# Patient Record
Sex: Male | Born: 2000
Health system: Southern US, Community
[De-identification: ages and names within clinical notes are randomized; demographics above are authoritative.]

## PROBLEM LIST (undated history)

## (undated) DIAGNOSIS — I10 Essential (primary) hypertension: Secondary | ICD-10-CM

---
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
## 2018-02-11 ENCOUNTER — Other Ambulatory Visit: Payer: Self-pay

## 2018-02-11 ENCOUNTER — Ambulatory Visit (HOSPITAL_COMMUNITY)
Admission: EM | Admit: 2018-02-11 | Discharge: 2018-02-11 | Disposition: A | Discharge status: Home / Self Care | Payer: 59 | Attending: Family Medicine | Admitting: Family Medicine

## 2018-02-11 ENCOUNTER — Encounter (HOSPITAL_COMMUNITY): Payer: Self-pay | Admitting: Emergency Medicine

## 2018-02-11 DIAGNOSIS — L730 Acne keloid: Secondary | ICD-10-CM

## 2018-02-11 MED ORDER — CEPHALEXIN 500 MG PO CAPS: 500.0000 mg | ORAL_CAPSULE | Freq: Two times a day (BID) | ORAL | 0 refills | Status: DC

## 2018-02-11 NOTE — ED Triage Notes (Signed)
Pt has a few small bumps that look like warts on the back of his head at the base.

## 2018-02-24 NOTE — ED Provider Notes (Signed)
[facility]   [id number] 02/11/18 Arrival Time: 1844  ASSESSMENT & PLAN:  1. Acne nuchae keloidalis     Meds ordered this encounter  Medications  . cephALEXin (KEFLEX) 500 MG capsule    Sig: Take 1 capsule (500 mg total) by mouth 2 (two) times daily.    Dispense:  20 capsule    Refill:  0   Do not shave. Should improve. He is very concerned over possible infection of this area. Given relatively quick onset, will tx with Keflex empirically.  Will follow up with PCP or here if worsening or failing to improve as anticipated. Reviewed expectations re: course of current medical issues. Questions answered. Outlined signs and symptoms indicating need for more acute intervention. Patient verbalized understanding. After Visit Summary given.   SUBJECTIVE:  Michael Hurst is a 17 y.o. male who presents with a skin complaint.   Location: posterior neck at hairline Has noticed for the past few days ago after shaving area. Associated pruritis? none Associated pain? none Progression: stable  Drainage? No  Known trigger? No  New soaps/lotions/topicals/detergents/environmental exposures? No Contacts with similar? No Recent travel? No  Other associated symptoms: none Therapies tried thus far: none Arthralgia or myalgia? none Recent illness? none Fever? none No specific aggravating or alleviating factors reported. No h/o similar reported.  ROS: As per HPI.  OBJECTIVE: Vitals:   02/11/18 1906  BP: (!) 143/49  Pulse: 67  Temp: 98.1 F (36.7 C)  TempSrc: Oral  SpO2: 100%    General appearance: alert; no distress Lungs: clear to auscultation bilaterally Heart: regular rate and rhythm Extremities: no edema Skin: warm and dry; signs of infection: no; acne nuchae keloidialis of posterior neck at hairline Psychological: alert and cooperative; normal mood and affect  No Known Allergies   Social History   Socioeconomic History  . Marital status: Single   Spouse name: Not on file  . Number of children: Not on file  . Years of education: Not on file  . Highest education level: Not on file  Occupational History  . Not on file  Social Needs  . Financial resource strain: Not on file  . Food insecurity:    Worry: Not on file    Inability: Not on file  . Transportation needs:    Medical: Not on file    Non-medical: Not on file  Tobacco Use  . Smoking status: Never Smoker  . Smokeless tobacco: Never Used  Substance and Sexual Activity  . Alcohol use: No  . Drug use: No  . Sexual activity: Not on file  Lifestyle  . Physical activity:    Days per week: Not on file    Minutes per session: Not on file  . Stress: Not on file  Relationships  . Social connections:    Talks on phone: Not on file    Gets together: Not on file    Attends religious service: Not on file    Active member of club or organization: Not on file    Attends meetings of clubs or organizations: Not on file    Relationship status: Not on file  . Intimate partner violence:    Fear of current or ex partner: Not on file    Emotionally abused: Not on file    Physically abused: Not on file    Forced sexual activity: Not on file  Other Topics Concern  . Not on file  Social History Narrative  . Not on file   History  reviewed. No pertinent family history. History reviewed. No pertinent surgical history.   Mardella Layman, MD 02/24/18 [phone]

## 2018-05-05 ENCOUNTER — Other Ambulatory Visit: Payer: Self-pay

## 2018-05-05 ENCOUNTER — Ambulatory Visit (HOSPITAL_COMMUNITY)
Admission: EM | Admit: 2018-05-05 | Discharge: 2018-05-05 | Disposition: A | Discharge status: Home / Self Care | Payer: 59 | Attending: Family Medicine | Admitting: Family Medicine

## 2018-05-05 ENCOUNTER — Encounter (HOSPITAL_COMMUNITY): Payer: Self-pay | Admitting: Emergency Medicine

## 2018-05-05 DIAGNOSIS — L730 Acne keloid: Secondary | ICD-10-CM | POA: Diagnosis not present

## 2018-05-05 MED ORDER — CLINDAMYCIN PHOSPHATE 1 % EX SOLN: CUTANEOUS | 0 refills | Status: DC

## 2018-05-05 MED ORDER — FLUOCINONIDE-E 0.05 % EX CREA: 1.0000 "application " | TOPICAL_CREAM | Freq: Two times a day (BID) | CUTANEOUS | 0 refills | Status: DC

## 2018-05-05 NOTE — ED Triage Notes (Signed)
Per pt he has a rash that has came back and is itching and sore on the back of his neck.

## 2018-05-05 NOTE — ED Provider Notes (Signed)
[facility]   [id number] 05/05/18 Arrival Time: 1144  ASSESSMENT & PLAN:  1. Acne nuchae keloidalis     Meds ordered this encounter  Medications  . clindamycin (CLEOCIN T) 1 % external solution    Sig: Apply to your skin once every morning.    Dispense:  60 mL    Refill:  0  . fluocinonide-emollient (LIDEX-E) 0.05 % cream    Sig: Apply 1 application topically 2 (two) times daily.    Dispense:  30 g    Refill:  0   He is going to schedule f/u with dermatology. May f/u here as needed. Avoid shaving/trimming hair too closely.  Reviewed expectations re: course of current medical issues. Questions answered. Outlined signs and symptoms indicating need for more acute intervention. Patient verbalized understanding. After Visit Summary given.   SUBJECTIVE:  Isabel CapriceVictor C Brouwer is a 17 y.o. male who presents with a skin complaint. Seen here 02/11/2018. Improved until he got his hair trimmed recently.  Location: posterior scalp at hairline Onset: gradual Duration: "maybe a week" Associated pruritis? none Associated pain? none Progression: stable  Drainage? No  Known trigger? quesitons related to trimming hair New soaps/lotions/topicals/detergents/environmental exposures? No Contacts with similar? No Recent travel? No  Other associated symptoms: none Therapies tried thus far: none since last visit Arthralgia or myalgia? none Recent illness? none Fever? none No specific aggravating or alleviating factors reported.  ROS: As per HPI.  OBJECTIVE: Vitals:   05/05/18 1202 05/05/18 1220  BP: 138/80   Pulse: 69   Resp: 16   Temp: 98.3 F (36.8 C)   TempSrc: Oral   SpO2: 100%   Weight: 104.3 kg 104.3 kg  Height: 6\' 3"  (1.905 m) 6\' 3"  (1.905 m)    General appearance: alert; no distress Lungs: clear to auscultation bilaterally Heart: regular rate and rhythm Extremities: no edema Skin: warm and dry; acne nuchae keloidialis of posterior neck at hairline (not as  significant vs last visit); no drainage; no tenderness to touch; no areas of fluctuance  No Known Allergies   Social History   Socioeconomic History  . Marital status: Single    Spouse name: Not on file  . Number of children: Not on file  . Years of education: Not on file  . Highest education level: Not on file  Occupational History  . Not on file  Social Needs  . Financial resource strain: Not on file  . Food insecurity:    Worry: Not on file    Inability: Not on file  . Transportation needs:    Medical: Not on file    Non-medical: Not on file  Tobacco Use  . Smoking status: Never Smoker  . Smokeless tobacco: Never Used  Substance and Sexual Activity  . Alcohol use: No  . Drug use: No  . Sexual activity: Not on file  Lifestyle  . Physical activity:    Days per week: Not on file    Minutes per session: Not on file  . Stress: Not on file  Relationships  . Social connections:    Talks on phone: Not on file    Gets together: Not on file    Attends religious service: Not on file    Active member of club or organization: Not on file    Attends meetings of clubs or organizations: Not on file    Relationship status: Not on file  . Intimate partner violence:    Fear of current or ex partner: Not on  file    Emotionally abused: Not on file    Physically abused: Not on file    Forced sexual activity: Not on file  Other Topics Concern  . Not on file  Social History Narrative  . Not on file   History reviewed. No pertinent family history. History reviewed. No pertinent surgical history.   Mardella Layman, MD 05/06/18 1126

## 2019-08-05 ENCOUNTER — Other Ambulatory Visit: Payer: Self-pay

## 2019-08-05 ENCOUNTER — Encounter (HOSPITAL_COMMUNITY): Payer: Self-pay

## 2019-08-05 ENCOUNTER — Ambulatory Visit (HOSPITAL_COMMUNITY)
Admission: EM | Admit: 2019-08-05 | Discharge: 2019-08-05 | Disposition: A | Discharge status: Home / Self Care | Payer: 59 | Attending: Urgent Care | Admitting: Urgent Care

## 2019-08-05 DIAGNOSIS — Z7251 High risk heterosexual behavior: Secondary | ICD-10-CM | POA: Diagnosis present

## 2019-08-05 DIAGNOSIS — R369 Urethral discharge, unspecified: Secondary | ICD-10-CM | POA: Diagnosis present

## 2019-08-05 DIAGNOSIS — N4889 Other specified disorders of penis: Secondary | ICD-10-CM | POA: Diagnosis present

## 2019-08-05 LAB — POCT URINALYSIS DIP (DEVICE)
Bilirubin Urine: NEGATIVE
Glucose, UA: NEGATIVE mg/dL
Hgb urine dipstick: NEGATIVE
Ketones, ur: NEGATIVE mg/dL
Nitrite: NEGATIVE
Protein, ur: NEGATIVE mg/dL
Specific Gravity, Urine: >=1.03 (ref 1.005–1.030)
Urobilinogen, UA: 1 mg/dL (ref 0.0–1.0)
pH: 6.5 (ref 5.0–8.0)

## 2019-08-05 MED ORDER — LIDOCAINE HCL (PF) 1 % IJ SOLN
INTRAMUSCULAR | Status: AC
  Filled 2019-08-05: qty 2

## 2019-08-05 MED ORDER — CEFTRIAXONE SODIUM 500 MG IJ SOLR
500.0000 mg | Freq: Once | INTRAMUSCULAR | Status: AC
  Administered 2019-08-05: 500 mg via INTRAMUSCULAR

## 2019-08-05 MED ORDER — DOXYCYCLINE HYCLATE 100 MG PO CAPS: 100.0000 mg | ORAL_CAPSULE | Freq: Two times a day (BID) | ORAL | 0 refills | Status: DC

## 2019-08-05 MED ORDER — CEFTRIAXONE SODIUM 500 MG IJ SOLR
INTRAMUSCULAR | Status: AC
  Filled 2019-08-05: qty 500

## 2019-08-05 NOTE — ED Triage Notes (Signed)
Patient reports he had an STI screening last month around the 23rd. Reports that everything was negative through [facility].   Now states that he is having tingling in his penis and states he feels like he is going to the restroom more than normal. Requesting STI testing.

## 2019-08-05 NOTE — ED Provider Notes (Signed)
[facility]   MRN: [id number] DOB: 2000/08/25  Subjective:   Michael Hurst is a 19 y.o. male presenting for 2-day history of penile discharge once, persistent tingling of the penis since then.  Patient had testing completed for STIs last month and was negative.  He states that he has not been sexually active since then and is confused about why he is having the symptoms.  Denies dysuria, hematuria, urinary frequency, penile swelling, testicular pain, testicular swelling, anal pain, groin pain.   No current facility-administered medications for this encounter.  Current Outpatient Medications:  .  clindamycin (CLEOCIN T) 1 % external solution, Apply to your skin once every morning., Disp: 60 mL, Rfl: 0 .  fluocinonide-emollient (LIDEX-E) 0.05 % cream, Apply 1 application topically 2 (two) times daily., Disp: 30 g, Rfl: 0 .  lisinopril-hydrochlorothiazide (ZESTORETIC) 20-12.5 MG tablet, Take 1 tablet by mouth daily. (Patient not taking: Reported on 11/23/2016), Disp: 90 tablet, Rfl: 3 .  meloxicam (MOBIC) 15 MG tablet, Take 1 tablet (15 mg total) by mouth daily., Disp: 30 tablet, Rfl: 0   No Known Allergies  History reviewed. No pertinent past medical history.   History reviewed. No pertinent surgical history.  No family history on file.  Social History   Tobacco Use  . Smoking status: Never Smoker  . Smokeless tobacco: Never Used  Substance Use Topics  . Alcohol use: No  . Drug use: No    ROS   Objective:   Vitals: BP (!) 165/78 (BP Location: Right Arm)   Pulse 69   Temp 98.2 F (36.8 C) (Oral)   Resp 16   Ht 6\' 3"  (1.905 m)   Wt 247 lb 9.6 oz (112.3 kg)   SpO2 100%   BMI 30.95 kg/m   Physical Exam Constitutional:      General: He is not in acute distress.    Appearance: Normal appearance. He is well-developed and normal weight. He is not ill-appearing, toxic-appearing or diaphoretic.  HENT:     Head: Normocephalic and atraumatic.     Right Ear:  External ear normal.     Left Ear: External ear normal.     Nose: Nose normal.     Mouth/Throat:     Pharynx: Oropharynx is clear.  Eyes:     General: No scleral icterus.       Right eye: No discharge.        Left eye: No discharge.     Extraocular Movements: Extraocular movements intact.     Pupils: Pupils are equal, round, and reactive to light.  Cardiovascular:     Rate and Rhythm: Normal rate.  Pulmonary:     Effort: Pulmonary effort is normal.  Genitourinary:    Penis: Circumcised. No phimosis, paraphimosis, hypospadias, erythema, tenderness, discharge, swelling or lesions.   Musculoskeletal:     Cervical back: Normal range of motion.  Skin:    General: Skin is warm and dry.  Neurological:     Mental Status: He is alert and oriented to person, place, and time.  Psychiatric:        Mood and Affect: Mood normal.        Behavior: Behavior normal.        Thought Content: Thought content normal.        Judgment: Judgment normal.    Results did not crossover, verbal report shows trace leukocytes and everything else was negative.  Assessment and Plan :   1. Penile discharge   2. Penile  irritation   3. Unprotected sex     Given symptoms today, will cover for STIs empirically as per CDC guidelines.  IM ceftriaxone in clinic, doxycycline for 7 days as an outpatient. Counseled on safe sex practices including abstaining for 1 week following treatment.  Counseled patient on potential for adverse effects with medications prescribed/recommended today, ER and return-to-clinic precautions discussed, patient verbalized understanding.    Jaynee Eagles, PA-C 08/05/19 1251

## 2019-08-05 NOTE — Discharge Instructions (Signed)
Avoid all forms of sexual intercourse (oral, vaginal, anal) for the next 7 days to avoid spreading/reinfecting. Return if symptoms worsen/do not resolve, you develop fever, abdominal pain, blood in your urine, or are re-exposed to an STI.  

## 2019-08-06 LAB — CYTOLOGY, (ORAL, ANAL, URETHRAL) ANCILLARY ONLY
Chlamydia: NEGATIVE
Neisseria Gonorrhea: NEGATIVE
Trichomonas: NEGATIVE

## 2019-08-15 ENCOUNTER — Other Ambulatory Visit: Payer: Self-pay

## 2019-08-15 ENCOUNTER — Ambulatory Visit (HOSPITAL_COMMUNITY)
Admission: EM | Admit: 2019-08-15 | Discharge: 2019-08-15 | Disposition: A | Discharge status: Home / Self Care | Payer: 59 | Attending: Family Medicine | Admitting: Family Medicine

## 2019-08-15 ENCOUNTER — Encounter (HOSPITAL_COMMUNITY): Payer: Self-pay

## 2019-08-15 DIAGNOSIS — R030 Elevated blood-pressure reading, without diagnosis of hypertension: Secondary | ICD-10-CM

## 2019-08-15 DIAGNOSIS — R058 Other specified cough: Secondary | ICD-10-CM

## 2019-08-15 DIAGNOSIS — R05 Cough: Secondary | ICD-10-CM

## 2019-08-15 DIAGNOSIS — U071 COVID-19: Secondary | ICD-10-CM | POA: Insufficient documentation

## 2019-08-15 DIAGNOSIS — Z20822 Contact with and (suspected) exposure to covid-19: Secondary | ICD-10-CM | POA: Diagnosis present

## 2019-08-15 NOTE — ED Provider Notes (Signed)
[facility]    CSN: [id number] Arrival date & time: 08/15/19  1008      History   Chief Complaint Chief Complaint  Patient presents with  . COVID test    HPI Michael Hurst is a 19 y.o. male.   HPI  Patient states that he had a sore throat, body aches, and some cold sweats last 2 days.  Some cough.  He states his sister just came in from out of town.  She has not been tested for Covid.  He does not have any definite exposure.  He works at [employer] work if he is having symptoms.  He needs a Covid test to return to work.  No shortness of breath.  No loss of taste or smell.  No nausea or vomiting.  No headache History of hypertension in the past.  He quit taking his medication.  He is advised to go back to his primary care doctor  History reviewed. No pertinent past medical history.  There are no problems to display for this patient.   History reviewed. No pertinent surgical history.     Home Medications    Prior to Admission medications   Medication Sig Start Date End Date Taking? Authorizing Provider  lisinopril-hydrochlorothiazide (ZESTORETIC) 20-12.5 MG tablet Take 1 tablet by mouth daily. Patient not taking: Reported on 11/23/2016 03/22/16   Scot Jun, FNP    Family History Family History  Problem Relation Age of Onset  . Hypertension Mother   . Hypertension Father     Social History Social History   Tobacco Use  . Smoking status: Never Smoker  . Smokeless tobacco: Never Used  Substance Use Topics  . Alcohol use: No  . Drug use: No     Allergies   Patient has no known allergies.   Review of Systems Review of Systems  Constitutional: Positive for chills and fatigue.  HENT: Positive for sore throat.   Respiratory: Positive for cough.   Musculoskeletal: Negative for myalgias.  Neurological: Negative for headaches.     Physical Exam Triage Vital Signs ED Triage Vitals [08/15/19 1038]  Enc Vitals Group     BP (!)  162/92     Pulse Rate 72     Resp 16     Temp 98.6 F (37 C)     Temp Source Oral     SpO2 99 %     Weight 235 lb (106.6 kg)     Height 6\' 3"  (1.905 m)     Head Circumference      Peak Flow      Pain Score 5     Pain Loc      Pain Edu?      Excl. in [place]?    No data found.  Updated Vital Signs BP (!) 162/92   Pulse 72   Temp 98.6 F (37 C) (Oral)   Resp 16   Ht 6\' 3"  (1.905 m)   Wt 106.6 kg   SpO2 99%   BMI 29.37 kg/m      Physical Exam Constitutional:      General: He is not in acute distress.    Appearance: He is well-developed and normal weight.     Comments: Appears tired  HENT:     Head: Normocephalic and atraumatic.     Mouth/Throat:     Comments: Mask in place Eyes:     Conjunctiva/sclera: Conjunctivae normal.     Pupils: Pupils are equal, round,  and reactive to light.  Cardiovascular:     Rate and Rhythm: Normal rate and regular rhythm.  Pulmonary:     Effort: Pulmonary effort is normal. No respiratory distress.     Breath sounds: Normal breath sounds.     Comments: Lungs are clear Musculoskeletal:        General: Normal range of motion.     Cervical back: Normal range of motion.  Skin:    General: Skin is warm and dry.  Neurological:     General: No focal deficit present.     Mental Status: He is alert.  Psychiatric:        Mood and Affect: Mood normal.        Behavior: Behavior normal.      UC Treatments / Results  Labs (all labs ordered are listed, but only abnormal results are displayed) Labs Reviewed  SARS CORONAVIRUS 2 (TAT 6-24 HRS)    EKG   Radiology No results found.  Procedures Procedures (including critical care time)  Medications Ordered in UC Medications - No data to display  Initial Impression / Assessment and Plan / UC Course  I have reviewed the triage vital signs and the nursing notes.  Pertinent labs & imaging results that were available during my care of the patient were reviewed by me and considered in my  medical decision making (see chart for details).     Reviewed the importance of quarantine.  How to get test results.  Rest, fluids, over-the-counter medications for symptomatic treatment Final Clinical Impressions(s) / UC Diagnoses   Final diagnoses:  Cough with exposure to COVID-19 virus  Elevated blood pressure reading     Discharge Instructions     Go home to rest Drink plenty of fluids Take Tylenol for pain or fever You may take over-the-counter cough and cold medicines as needed You must quarantine at home until your test result is available You can check for your test result in MyChart See your PCP about the high blood pressure   ED Prescriptions    None     PDMP not reviewed this encounter.   Eustace Moore, MD 08/15/19 1100

## 2019-08-15 NOTE — Discharge Instructions (Addendum)
Go home to rest Drink plenty of fluids Take Tylenol for pain or fever You may take over-the-counter cough and cold medicines as needed You must quarantine at home until your test result is available You can check for your test result in MyChart See your PCP about the high blood pressure

## 2019-08-15 NOTE — ED Triage Notes (Signed)
Pt wants a COVID test, because his sister "hasn't been feeling well" and she just got in from being out of town. Pt wants a COVID test to make sure he doesn't have it. Pt states he has sore throat, cold sweats, muscle achesx3 days.

## 2019-08-16 ENCOUNTER — Telehealth (HOSPITAL_COMMUNITY): Payer: Self-pay

## 2019-08-16 LAB — SARS CORONAVIRUS 2 (TAT 6-24 HRS): SARS Coronavirus 2: POSITIVE — AB

## 2019-08-16 NOTE — Telephone Encounter (Signed)
Your test for COVID-19 was positive ("detected"), meaning that you were infected with the novel coronavirus and could give the germ to others.    Please continue isolation at home, for at least 10 days since the start of your fever/cough/breathlessness and until you have had 24 hours without fever (without taking a fever reducer) and with any cough/breathlessness improving. Use over-the-counter medications for symptoms. A work note has been updated and placed in MyChart, you may also pick up a copy at Urgent Care.   Please continue good preventive care measures, including:  frequent hand-washing, avoid touching your face, cover coughs/sneezes, stay out of crowds and keep a 6 foot distance from others.  Clean hard surfaces touched frequently with disinfectant cleaning products.   Please check in with your primary care provider about your positive test result.  Go to the nearest urgent care or ED for assessment if you have severe breathlessness or severe weakness/fatigue (ex needing new help getting out of bed or to the bathroom).  Members of your household will also need to quarantine for 14 days from the date of your positive test. You may be contacted to discuss possible treatment options, and you may also be contacted by the health department for follow up. Please call [facility] at [phone] if you have any questions or concerns.

## 2019-08-23 ENCOUNTER — Ambulatory Visit: Payer: 59 | Attending: Family

## 2019-08-23 ENCOUNTER — Ambulatory Visit (HOSPITAL_COMMUNITY): Admission: EM | Admit: 2019-08-23 | Discharge: 2019-08-23 | Discharge status: Against Medical Advice | Payer: 59

## 2019-09-01 ENCOUNTER — Ambulatory Visit (HOSPITAL_COMMUNITY)
Admission: EM | Admit: 2019-09-01 | Discharge: 2019-09-01 | Disposition: A | Discharge status: Home / Self Care | Payer: 59 | Attending: Urgent Care | Admitting: Urgent Care

## 2019-09-01 ENCOUNTER — Other Ambulatory Visit: Payer: Self-pay

## 2019-09-01 ENCOUNTER — Encounter (HOSPITAL_COMMUNITY): Payer: Self-pay | Admitting: Emergency Medicine

## 2019-09-01 DIAGNOSIS — R369 Urethral discharge, unspecified: Secondary | ICD-10-CM | POA: Insufficient documentation

## 2019-09-01 LAB — POCT URINALYSIS DIP (DEVICE)
Bilirubin Urine: NEGATIVE
Glucose, UA: NEGATIVE mg/dL
Hgb urine dipstick: NEGATIVE
Ketones, ur: NEGATIVE mg/dL
Nitrite: NEGATIVE
Protein, ur: NEGATIVE mg/dL
Specific Gravity, Urine: >=1.03 (ref 1.005–1.030)
Urobilinogen, UA: 0.2 mg/dL (ref 0.0–1.0)
pH: 6.5 (ref 5.0–8.0)

## 2019-09-01 LAB — HIV ANTIBODY (ROUTINE TESTING W REFLEX): HIV Screen 4th Generation wRfx: NONREACTIVE

## 2019-09-01 NOTE — Discharge Instructions (Signed)
Please make sure you are hydrating well with plain water or naturally flavored water.  You should be drinking 64 ounces of water daily in general but should drink 96 ounces of water if you are more active at work or doing exercise.  Please make sure you are avoiding urinary irritants like sodas, over-the-counter juices, alcohol, drinking a lot of coffee or energy drinks.  If your symptoms persist and your testing is negative, the next step is to see an urologist.

## 2019-09-01 NOTE — ED Provider Notes (Signed)
[facility]   MRN: [id number] DOB: [birth year]  Subjective:   DANYAL WHITENACK is a 19 y.o. male presenting for recurrent mild penile discharge and penile tingling yesterday. Patient last had STI testing 08/05/2019. He was treated empirically at that OV due to having penile discharge, was given IM ceftriaxone, PO doxycycline.  His testing actually turned out to be negative.  Patient admits that he does not hydrate with water, drinks over-the-counter cranberry juice almost exclusively. Denies dysuria, hematuria, urinary frequency, penile swelling, testicular pain, testicular swelling, anal pain, groin pain.   No current facility-administered medications for this encounter.  Current Outpatient Medications:  .  lisinopril-hydrochlorothiazide (ZESTORETIC) 20-12.5 MG tablet, Take 1 tablet by mouth daily. (Patient not taking: Reported on 11/23/2016), Disp: 90 tablet, Rfl: 3   No Known Allergies  No past medical history on file.   No past surgical history on file.  Family History  Problem Relation Age of Onset  . Hypertension Mother   . Hypertension Father     Social History   Tobacco Use  . Smoking status: Never Smoker  . Smokeless tobacco: Never Used  Substance Use Topics  . Alcohol use: No  . Drug use: No    ROS   Objective:   Vitals: BP 133/76   Pulse 75   Temp 98.5 F (36.9 C) (Oral)   Resp 16   SpO2 99%   Physical Exam Constitutional:      General: He is not in acute distress.    Appearance: Normal appearance. He is well-developed and normal weight. He is not ill-appearing, toxic-appearing or diaphoretic.  HENT:     Head: Normocephalic and atraumatic.     Right Ear: External ear normal.     Left Ear: External ear normal.     Nose: Nose normal.     Mouth/Throat:     Pharynx: Oropharynx is clear.  Eyes:     General: No scleral icterus.       Right eye: No discharge.        Left eye: No discharge.     Extraocular Movements: Extraocular movements  intact.     Pupils: Pupils are equal, round, and reactive to light.  Cardiovascular:     Rate and Rhythm: Normal rate.  Pulmonary:     Effort: Pulmonary effort is normal.  Musculoskeletal:     Cervical back: Normal range of motion.  Neurological:     Mental Status: He is alert and oriented to person, place, and time.  Psychiatric:        Mood and Affect: Mood normal.        Behavior: Behavior normal.        Thought Content: Thought content normal.        Judgment: Judgment normal.     Results for orders placed or performed during the hospital encounter of 09/01/19 (from the past 24 hour(s))  POCT urinalysis dip (device)     Status: Abnormal   Collection Time: 09/01/19  1:48 PM  Result Value Ref Range   Glucose, UA NEGATIVE NEGATIVE mg/dL   Bilirubin Urine NEGATIVE NEGATIVE   Ketones, ur NEGATIVE NEGATIVE mg/dL   Specific Gravity, Urine >=1.030 1.005 - 1.030   Hgb urine dipstick NEGATIVE NEGATIVE   pH 6.5 5.0 - 8.0   Protein, ur NEGATIVE NEGATIVE mg/dL   Urobilinogen, UA 0.2 0.0 - 1.0 mg/dL   Nitrite NEGATIVE NEGATIVE   Leukocytes,Ua TRACE (A) NEGATIVE     Recent Results (from the past 2160  hour(s))  Cytology (oral, anal, urethral) ancillary only     Status: None   Collection Time: 08/05/19 12:00 AM  Result Value Ref Range   Chlamydia Negative     Comment: Normal Reference Range - Negative   Neisseria Gonorrhea Negative     Comment: Normal Reference Range - Negative   Trichomonas Negative     Comment: Normal Reference Range - Negative  POCT urinalysis dip (device)     Status: Abnormal   Collection Time: 08/05/19 11:37 AM  Result Value Ref Range   Glucose, UA NEGATIVE NEGATIVE mg/dL   Bilirubin Urine NEGATIVE NEGATIVE   Ketones, ur NEGATIVE NEGATIVE mg/dL   Specific Gravity, Urine >=1.030 1.005 - 1.030   Hgb urine dipstick NEGATIVE NEGATIVE   pH 6.5 5.0 - 8.0   Protein, ur NEGATIVE NEGATIVE mg/dL   Urobilinogen, UA 1.0 0.0 - 1.0 mg/dL   Nitrite NEGATIVE NEGATIVE    Leukocytes,Ua TRACE (A) NEGATIVE    Comment: Biochemical Testing Only. Please order routine urinalysis from main lab if confirmatory testing is needed.  SARS CORONAVIRUS 2 (TAT 6-24 HRS) Nasopharyngeal Nasopharyngeal Swab     Status: Abnormal   Collection Time: 08/15/19 10:44 AM   Specimen: Nasopharyngeal Swab  Result Value Ref Range   SARS Coronavirus 2 POSITIVE (A) NEGATIVE    Comment: RESULT CALLED TO, READ BACK BY AND VERIFIED WITH: EMAILED MELISSA BROWNING 0031 08/16/2019 T. TYSOR (NOTE) SARS-CoV-2 target nucleic acids are DETECTED. The SARS-CoV-2 RNA is generally detectable in upper and lower respiratory specimens during the acute phase of infection. Positive results are indicative of the presence of SARS-CoV-2 RNA. Clinical correlation with patient history and other diagnostic information is  necessary to determine patient infection status. Positive results do not rule out bacterial infection or co-infection with other viruses.  The expected result is Negative. Fact Sheet for Patients: SugarRoll.be Fact Sheet for Healthcare Providers: https://www.woods-mathews.com/ This test is not yet approved or cleared by the [place] FDA and  has been authorized for detection and/or diagnosis of SARS-CoV-2 by FDA under an Emergency Use Authorization (EUA). This EUA will remain  in effect (meaning this test can be  used) for the duration of the COVID-19 declaration under Section 564(b)(1) of the Act, 21 U.S.C. section 360bbb-3(b)(1), unless the authorization is terminated or revoked sooner. Performed at [facility], [place] [address], [place], [place] [address]      Assessment and Plan :   1. Penile discharge     Will hold off on empiric treatment today given that he just had this for similar symptoms set with negative testing.  Counseled patient that we will manage this for urinary irritation secondary to not hydrating with water  and drinking urinary irritant of cranberry juice almost exclusively.  Labs pending, will treat as appropriate.  If symptoms persist, patient could seek consult with urology, information provided to him.  Patient was in agreement with treatment plan. Counseled patient on potential for adverse effects with medications prescribed/recommended today, ER and return-to-clinic precautions discussed, patient verbalized understanding.    Jaynee Eagles, PA-C 09/01/19 1441

## 2019-09-01 NOTE — ED Triage Notes (Signed)
"  pinching" sensation and penile discharge yesterday.

## 2019-09-02 LAB — URINE CULTURE: Culture: NO GROWTH

## 2019-09-02 LAB — CYTOLOGY, (ORAL, ANAL, URETHRAL) ANCILLARY ONLY
Chlamydia: NEGATIVE
Comment: NEGATIVE
Comment: NEGATIVE
Comment: NORMAL
Neisseria Gonorrhea: NEGATIVE
Trichomonas: NEGATIVE

## 2019-09-02 LAB — RPR: RPR Ser Ql: NONREACTIVE

## 2019-11-01 MED FILL — CIPROFLOXACIN HCL 500 MG TA: 500 | 30 days supply | Qty: 60 | Fill #0

## 2019-12-22 ENCOUNTER — Ambulatory Visit (HOSPITAL_COMMUNITY)
Admission: EM | Admit: 2019-12-22 | Discharge: 2019-12-22 | Disposition: A | Discharge status: Home / Self Care | Payer: 59 | Attending: Physician Assistant | Admitting: Physician Assistant

## 2019-12-22 ENCOUNTER — Other Ambulatory Visit: Payer: Self-pay

## 2019-12-22 ENCOUNTER — Encounter (HOSPITAL_COMMUNITY): Payer: Self-pay

## 2019-12-22 DIAGNOSIS — Z113 Encounter for screening for infections with a predominantly sexual mode of transmission: Secondary | ICD-10-CM | POA: Diagnosis not present

## 2019-12-22 NOTE — ED Provider Notes (Signed)
[facility]    CSN: [id number] Arrival date & time: 12/22/19  1133      History   Chief Complaint Chief Complaint  Patient presents with  . SEXUALLY TRANSMITTED DISEASE    HPI Michael Hurst is a 19 y.o. male.   Patient reports for STD screening.  He denies symptoms of discharge or painful urination.  He would only like to have a urethral swab done.  Declines lab work for HIV and syphilis.  No known exposures.     History reviewed. No pertinent past medical history.  There are no problems to display for this patient.   History reviewed. No pertinent surgical history.     Home Medications    Prior to Admission medications   Medication Sig Start Date End Date Taking? Authorizing Provider  lisinopril-hydrochlorothiazide (ZESTORETIC) 20-12.5 MG tablet Take 1 tablet by mouth daily. Patient not taking: Reported on 11/23/2016 03/22/16   Bing Neighbors, FNP    Family History Family History  Problem Relation Age of Onset  . Hypertension Mother   . Hypertension Father     Social History Social History   Tobacco Use  . Smoking status: Never Smoker  . Smokeless tobacco: Never Used  Vaping Use  . Vaping Use: Never used  Substance Use Topics  . Alcohol use: No  . Drug use: No     Allergies   Patient has no known allergies.   Review of Systems Review of Systems   Physical Exam Triage Vital Signs ED Triage Vitals  Enc Vitals Group     BP 12/22/19 1215 (!) 149/79     Pulse Rate 12/22/19 1215 61     Resp 12/22/19 1215 14     Temp 12/22/19 1215 98.4 F (36.9 C)     Temp Source 12/22/19 1215 Oral     SpO2 12/22/19 1215 100 %     Weight --      Height --      Head Circumference --      Peak Flow --      Pain Score 12/22/19 1222 0     Pain Loc --      Pain Edu? --      Excl. in [place]? --    No data found.  Updated Vital Signs BP (!) 149/79 (BP Location: Left Arm)   Pulse 61   Temp 98.4 F (36.9 C) (Oral)   Resp 14   SpO2 100%    Visual Acuity Right Eye Distance:   Left Eye Distance:   Bilateral Distance:    Right Eye Near:   Left Eye Near:    Bilateral Near:     Physical Exam Vitals and nursing note reviewed.  Constitutional:      Appearance: Normal appearance.  Genitourinary:    Comments: Deferred for self swab Neurological:     Mental Status: He is alert.      UC Treatments / Results  Labs (all labs ordered are listed, but only abnormal results are displayed) Labs Reviewed  CYTOLOGY, (ORAL, ANAL, URETHRAL) ANCILLARY ONLY    EKG   Radiology No results found.  Procedures Procedures (including critical care time)  Medications Ordered in UC Medications - No data to display  Initial Impression / Assessment and Plan / UC Course  I have reviewed the triage vital signs and the nursing notes.  Pertinent labs & imaging results that were available during my care of the patient were reviewed by me and considered in  my medical decision making (see chart for details).     STD screen Patient is 19 year old presenting for STD screen.  Asymptomatic.  Cytology swab sent.  Safe sex discussed. Final Clinical Impressions(s) / UC Diagnoses   Final diagnoses:  Screen for STD (sexually transmitted disease)     Discharge Instructions     We will call if any of your results are positive, otherwise these will be in your mychart  Always use condoms and practice safe sex    ED Prescriptions    None     PDMP not reviewed this encounter.   Hermelinda Medicus, PA-C 12/22/19 2116

## 2019-12-22 NOTE — Discharge Instructions (Signed)
We will call if any of your results are positive, otherwise these will be in your mychart  Always use condoms and practice safe sex

## 2019-12-22 NOTE — ED Triage Notes (Signed)
Pt presents to UC for "std check up". Pt denies symptoms such as burning, itching, rash, discharge, lower abdominal pain, n/v/d, fevers. Pt states "I just haven't had one in a while".

## 2019-12-23 LAB — CYTOLOGY, (ORAL, ANAL, URETHRAL) ANCILLARY ONLY
Chlamydia: NEGATIVE
Comment: NEGATIVE
Comment: NEGATIVE
Comment: NORMAL
Neisseria Gonorrhea: NEGATIVE
Trichomonas: NEGATIVE

## 2019-12-31 MED FILL — CIPROFLOXACIN HCL 500 MG TA: 500 | 30 days supply | Qty: 60 | Fill #0

## 2020-02-12 ENCOUNTER — Ambulatory Visit (HOSPITAL_COMMUNITY)
Admission: EM | Admit: 2020-02-12 | Discharge: 2020-02-12 | Disposition: A | Discharge status: Home / Self Care | Payer: 59 | Attending: Emergency Medicine | Admitting: Emergency Medicine

## 2020-02-12 ENCOUNTER — Encounter (HOSPITAL_COMMUNITY): Payer: Self-pay

## 2020-02-12 ENCOUNTER — Other Ambulatory Visit: Payer: Self-pay

## 2020-02-12 DIAGNOSIS — R360 Urethral discharge without blood: Secondary | ICD-10-CM | POA: Diagnosis present

## 2020-02-12 DIAGNOSIS — Z7251 High risk heterosexual behavior: Secondary | ICD-10-CM | POA: Diagnosis not present

## 2020-02-12 LAB — HIV ANTIBODY (ROUTINE TESTING W REFLEX): HIV Screen 4th Generation wRfx: NONREACTIVE

## 2020-02-12 MED ORDER — DOXYCYCLINE HYCLATE 100 MG PO CAPS: 100.0000 mg | ORAL_CAPSULE | Freq: Two times a day (BID) | ORAL | 0 refills | Status: DC

## 2020-02-12 MED ORDER — CEFTRIAXONE SODIUM 500 MG IJ SOLR
INTRAMUSCULAR | Status: AC
  Filled 2020-02-12: qty 500

## 2020-02-12 MED ORDER — LIDOCAINE HCL (PF) 1 % IJ SOLN
INTRAMUSCULAR | Status: AC
  Filled 2020-02-12: qty 2

## 2020-02-12 MED ORDER — CEFTRIAXONE SODIUM 500 MG IJ SOLR: 500.0000 mg | Freq: Once | INTRAMUSCULAR | Status: DC

## 2020-02-12 NOTE — ED Notes (Signed)
I drew up rocephin medication. When I went to the room patient told provider that he did not want the shot. Medication was disposed.

## 2020-02-12 NOTE — ED Triage Notes (Signed)
Pt c/o white penile discharge that started today. Pt wants STI testing.

## 2020-02-12 NOTE — ED Provider Notes (Signed)
[facility]    CSN: [id number] Arrival date & time: 02/12/20  1242      History   Chief Complaint Chief Complaint  Patient presents with  . penile discharge    HPI Michael Hurst is a 19 y.o. male  Presenting for penile discharge without penile or testicular pain, swelling or urinary symptoms x1 day.  Currently sexually active, not routinely using condoms.  No known exposure.    History reviewed. No pertinent past medical history.  There are no problems to display for this patient.   History reviewed. No pertinent surgical history.     Home Medications    Prior to Admission medications   Medication Sig Start Date End Date Taking? Authorizing Provider  doxycycline (VIBRAMYCIN) 100 MG capsule Take 1 capsule (100 mg total) by mouth 2 (two) times daily. 02/12/20   Hall-Potvin, [place], PA-C  lisinopril-hydrochlorothiazide (ZESTORETIC) 20-12.5 MG tablet Take 1 tablet by mouth daily. Patient not taking: Reported on 11/23/2016 03/22/16   Bing Neighbors, FNP    Family History Family History  Problem Relation Age of Onset  . Hypertension Mother   . Hypertension Father     Social History Social History   Tobacco Use  . Smoking status: Never Smoker  . Smokeless tobacco: Never Used  Vaping Use  . Vaping Use: Never used  Substance Use Topics  . Alcohol use: Yes    Comment: occ  . Drug use: No     Allergies   Patient has no known allergies.   Review of Systems As per HPI   Physical Exam Triage Vital Signs ED Triage Vitals  Enc Vitals Group     BP 02/12/20 1334 (!) 141/83     Pulse Rate 02/12/20 1334 61     Resp 02/12/20 1334 16     Temp 02/12/20 1334 98.6 F (37 C)     Temp Source 02/12/20 1334 Oral     SpO2 02/12/20 1334 100 %     Weight 02/12/20 1336 237 lb (107.5 kg)     Height 02/12/20 1336 6\' 4"  (1.93 m)     Head Circumference --      Peak Flow --      Pain Score 02/12/20 1336 0     Pain Loc --      Pain Edu? --      Excl. in  GC? --    No data found.  Updated Vital Signs BP (!) 141/83   Pulse 61   Temp 98.6 F (37 C) (Oral)   Resp 16   Ht 6\' 4"  (1.93 m)   Wt 237 lb (107.5 kg)   SpO2 100%   BMI 28.85 kg/m   Visual Acuity Right Eye Distance:   Left Eye Distance:   Bilateral Distance:    Right Eye Near:   Left Eye Near:    Bilateral Near:     Physical Exam Constitutional:      General: He is not in acute distress. HENT:     Head: Normocephalic and atraumatic.  Eyes:     General: No scleral icterus.    Pupils: Pupils are equal, round, and reactive to light.  Cardiovascular:     Rate and Rhythm: Normal rate.  Pulmonary:     Effort: Pulmonary effort is normal. No respiratory distress.     Breath sounds: No wheezing.  Genitourinary:    Comments: Self swab performed Skin:    Coloration: Skin is not jaundiced or pale.  Neurological:  Mental Status: He is alert and oriented to person, place, and time.      UC Treatments / Results  Labs (all labs ordered are listed, but only abnormal results are displayed) Labs Reviewed  HIV ANTIBODY (ROUTINE TESTING W REFLEX)  RPR  CYTOLOGY, (ORAL, ANAL, URETHRAL) ANCILLARY ONLY    EKG   Radiology No results found.  Procedures Procedures (including critical care time)  Medications Ordered in UC Medications - No data to display  Initial Impression / Assessment and Plan / UC Course  I have reviewed the triage vital signs and the nursing notes.  Pertinent labs & imaging results that were available during my care of the patient were reviewed by me and considered in my medical decision making (see chart for details).     Cytology pending.  States this has happened in the past; was referred to urology due to lack of STI findings with persistent discharge.  Has a contact information, intends to follow-up Monday.  Declines treatment today, will remain abstinent until results are final.  Return precautions discussed, pt verbalized understanding  and is agreeable to plan. Final Clinical Impressions(s) / UC Diagnoses   Final diagnoses:  Penile discharge, without blood  Unprotected sex     Discharge Instructions     Testing for chlamydia, gonorrhea, trichomonas is pending: please look for these results on the MyChart app/website.  We will notify you if you are positive and outline treatment at that time.  Important to avoid all forms of sexual intercourse (oral, vaginal, anal) with any/all partners for the next 7 days to avoid spreading/reinfecting. Any/all sexual partners should be notified of testing/treatment today.  Return for persistent/worsening symptoms or if you develop fever, abdominal or pelvic pain, discharge, genital pain, blood in your urine, or are re-exposed to an STI.    ED Prescriptions    Medication Sig Dispense Auth. Provider   doxycycline (VIBRAMYCIN) 100 MG capsule Take 1 capsule (100 mg total) by mouth 2 (two) times daily. 20 capsule Hall-Potvin, [place], PA-C     PDMP not reviewed this encounter.   Hall-Potvin, [place], [place] 02/12/20 1412

## 2020-02-12 NOTE — Discharge Instructions (Addendum)

## 2020-02-13 LAB — RPR: RPR Ser Ql: NONREACTIVE

## 2020-02-15 LAB — CYTOLOGY, (ORAL, ANAL, URETHRAL) ANCILLARY ONLY
Chlamydia: NEGATIVE
Comment: NEGATIVE
Comment: NEGATIVE
Comment: NORMAL
Neisseria Gonorrhea: NEGATIVE
Trichomonas: NEGATIVE

## 2020-02-16 MED FILL — CIPROFLOXACIN HCL 500 MG TA: 500 | 28 days supply | Qty: 56 | Fill #0

## 2020-07-22 ENCOUNTER — Other Ambulatory Visit: Payer: Self-pay

## 2020-07-22 ENCOUNTER — Encounter (HOSPITAL_COMMUNITY): Payer: Self-pay | Admitting: Emergency Medicine

## 2020-07-22 ENCOUNTER — Ambulatory Visit (HOSPITAL_COMMUNITY)
Admission: EM | Admit: 2020-07-22 | Discharge: 2020-07-22 | Disposition: A | Discharge status: Home / Self Care | Payer: 59 | Attending: Family Medicine | Admitting: Family Medicine

## 2020-07-22 DIAGNOSIS — Z711 Person with feared health complaint in whom no diagnosis is made: Secondary | ICD-10-CM | POA: Diagnosis present

## 2020-07-22 DIAGNOSIS — M25561 Pain in right knee: Secondary | ICD-10-CM

## 2020-07-22 MED ORDER — MELOXICAM 15 MG PO TABS: 15.0000 mg | ORAL_TABLET | Freq: Every day | ORAL | 0 refills | Status: DC

## 2020-07-22 NOTE — ED Provider Notes (Signed)
[facility]    CSN: [id number] Arrival date & time: 07/22/20  1007      History   Chief Complaint Chief Complaint  Patient presents with  . Knee Pain    HPI Michael Hurst is a 20 y.o. male.   HPI  Patient presents today for evaluation of right knee pain. Patient reports a injury which occurred over 4 years ago which she was evaluated by an orthopedic specialist and reports he had fluid removed from his knee at that time.  He reports pain over 100% resolved and occurs with activity.  He has not wrapped the knee.  Pain is precipitated by vigorous activity such as exercise.  He has not attempted relief with any over-the-counter medications.  He denies any recent injury.   Patient is in today for STD testing.  Endorses a possible exposure.  Patient has had previous STD testing back in August which was negative for any infection. He is unaware of any specific exposure today.  History reviewed. No pertinent past medical history.  There are no problems to display for this patient.   History reviewed. No pertinent surgical history.     Home Medications    Prior to Admission medications   Medication Sig Start Date End Date Taking? Authorizing Provider  doxycycline (VIBRAMYCIN) 100 MG capsule Take 1 capsule (100 mg total) by mouth 2 (two) times daily. 02/12/20   Hall-Potvin, [place], PA-C  lisinopril-hydrochlorothiazide (ZESTORETIC) 20-12.5 MG tablet Take 1 tablet by mouth daily. Patient not taking: No sig reported 03/22/16   Bing Neighbors, FNP    Family History Family History  Problem Relation Age of Onset  . Hypertension Mother   . Hypertension Father     Social History Social History   Tobacco Use  . Smoking status: Never Smoker  . Smokeless tobacco: Never Used  Vaping Use  . Vaping Use: Never used  Substance Use Topics  . Alcohol use: Yes    Comment: occ  . Drug use: No     Allergies   Patient has no known allergies.   Review of  Systems Review of Systems Pertinent negatives listed in HPI   Physical Exam Triage Vital Signs ED Triage Vitals  Enc Vitals Group     BP 07/22/20 1032 (!) 154/90     Pulse Rate 07/22/20 1032 71     Resp 07/22/20 1032 18     Temp 07/22/20 1032 98.2 F (36.8 C)     Temp Source 07/22/20 1032 Oral     SpO2 07/22/20 1032 98 %     Weight --      Height --      Head Circumference --      Peak Flow --      Pain Score 07/22/20 1031 5     Pain Loc --      Pain Edu? --      Excl. in GC? --    No data found.  Updated Vital Signs BP (!) 154/90 (BP Location: Right Arm)   Pulse 71   Temp 98.2 F (36.8 C) (Oral)   Resp 18   SpO2 98%   Visual Acuity Right Eye Distance:   Left Eye Distance:   Bilateral Distance:    Right Eye Near:   Left Eye Near:    Bilateral Near:     Physical Exam   UC Treatments / Results  Labs (all labs ordered are listed, but only abnormal results are displayed) Labs Reviewed  CYTOLOGY, (ORAL, ANAL, URETHRAL) ANCILLARY ONLY    EKG   Radiology No results found.  Procedures Procedures (including critical care time)  Medications Ordered in UC Medications - No data to display  Initial Impression / Assessment and Plan / UC Course  I have reviewed the triage vital signs and the nursing notes.  Pertinent labs & imaging results that were available during my care of the patient were reviewed by me and considered in my medical decision making (see chart for details).      Acute right knee pain, physical exam findings completely unremarkable.  There is no acute knee swelling or deformity noted to the knee.  Therefore recommended Ace wrapping as needed along with meloxicam as needed.  If knee pain continues recommended follow-up with [facility] given this is a chronic issue ongoing for greater than a year.  STD testing pending.  No treatment given administered here in clinic today.  Will await cytology results.  Patient advised that cytology  will result in 2 to 3 days and he will be notified only if results are abnormal. Final Clinical Impressions(s) / UC Diagnoses   Final diagnoses:  Right knee pain, unspecified chronicity  Concern about STD in male without diagnosis     Discharge Instructions     STD test results will result immediately to your MyChart within the next 2 to 3 days.  If results require indicate any need for treatment we will notify you.  For knee pain recommend Ace wrapping knee prior to any vigorous activity.  For acute knee pain take meloxicam 15 mg daily as needed for knee pain. If pain continues in spite of treatment recommend follow-up with [facility]oped[facility].    ED Prescriptions    Medication Sig Dispense Auth. Provider   meloxicam (MOBIC) 15 MG tablet Take 1 tablet (15 mg total) by mouth daily. 30 tablet Bing Neighbors, FNP     PDMP not reviewed this encounter.   Bing Neighbors, FNP 07/22/20 [phone]

## 2020-07-22 NOTE — ED Triage Notes (Signed)
Pt is present today with right knee pain and possible STD exposure. Pt states that he that he had a knee injury four years ago and he is still having pain. Pt states that with any exercise or physical activity afterwards he is experiencing pain in his right knee. Denies any swelling or discoloration. Pt also denies any sx with possible std exposure.

## 2020-07-22 NOTE — Discharge Instructions (Addendum)
STD test results will result immediately to your MyChart within the next 2 to 3 days.  If results require indicate any need for treatment we will notify you.  For knee pain recommend Ace wrapping knee prior to any vigorous activity.  For acute knee pain take meloxicam 15 mg daily as needed for knee pain. If pain continues in spite of treatment recommend follow-up with [facility] orthopedics.

## 2020-07-24 LAB — CYTOLOGY, (ORAL, ANAL, URETHRAL) ANCILLARY ONLY
Chlamydia: NEGATIVE
Comment: NEGATIVE
Comment: NEGATIVE
Comment: NORMAL
Neisseria Gonorrhea: NEGATIVE
Trichomonas: NEGATIVE

## 2020-08-14 ENCOUNTER — Other Ambulatory Visit: Payer: Self-pay

## 2020-08-14 ENCOUNTER — Encounter (HOSPITAL_COMMUNITY): Payer: Self-pay | Admitting: Emergency Medicine

## 2020-08-14 ENCOUNTER — Ambulatory Visit (HOSPITAL_COMMUNITY)
Admission: EM | Admit: 2020-08-14 | Discharge: 2020-08-14 | Disposition: A | Discharge status: Home / Self Care | Payer: 59 | Attending: Emergency Medicine | Admitting: Emergency Medicine

## 2020-08-14 DIAGNOSIS — Z113 Encounter for screening for infections with a predominantly sexual mode of transmission: Secondary | ICD-10-CM

## 2020-08-14 NOTE — ED Provider Notes (Signed)
[facility]    CSN: [id number] Arrival date & time: 08/14/20  2836      History   Chief Complaint Chief Complaint  Patient presents with  . Exposure to STD    HPI FARD BORUNDA is a 20 y.o. male.   Patient presents requesting sti screening. Denies recent exposure. Denies discharge, itching, dysuria, rash, lesions, frequency, urgency, abdominal or back pain.   History reviewed. No pertinent past medical history.  There are no problems to display for this patient.   History reviewed. No pertinent surgical history.     Home Medications    Prior to Admission medications   Medication Sig Start Date End Date Taking? Authorizing Provider  doxycycline (VIBRAMYCIN) 100 MG capsule Take 1 capsule (100 mg total) by mouth 2 (two) times daily. 02/12/20   Hall-Potvin, [place], PA-C  lisinopril-hydrochlorothiazide (ZESTORETIC) 20-12.5 MG tablet Take 1 tablet by mouth daily. Patient not taking: No sig reported 03/22/16   Bing Neighbors, FNP  meloxicam (MOBIC) 15 MG tablet Take 1 tablet (15 mg total) by mouth daily. 07/22/20   Bing Neighbors, FNP    Family History Family History  Problem Relation Age of Onset  . Hypertension Mother   . Hypertension Father     Social History Social History   Tobacco Use  . Smoking status: Never Smoker  . Smokeless tobacco: Never Used  Vaping Use  . Vaping Use: Never used  Substance Use Topics  . Alcohol use: Yes    Comment: occ  . Drug use: No     Allergies   Patient has no known allergies.   Review of Systems Review of Systems  Constitutional: Negative.   Respiratory: Negative.   Cardiovascular: Negative.   Gastrointestinal: Negative.   Genitourinary: Negative.   Skin: Negative.      Physical Exam Triage Vital Signs ED Triage Vitals  Enc Vitals Group     BP 08/14/20 0929 (!) 147/85     Pulse Rate 08/14/20 0929 63     Resp 08/14/20 0929 16     Temp 08/14/20 0929 98.5 F (36.9 C)     Temp Source  08/14/20 0929 Oral     SpO2 08/14/20 0929 98 %     Weight --      Height --      Head Circumference --      Peak Flow --      Pain Score 08/14/20 0928 0     Pain Loc --      Pain Edu? --      Excl. in GC? --    No data found.  Updated Vital Signs BP (!) 147/85 (BP Location: Left Arm)   Pulse 63   Temp 98.5 F (36.9 C) (Oral)   Resp 16   SpO2 98%   Visual Acuity Right Eye Distance:   Left Eye Distance:   Bilateral Distance:    Right Eye Near:   Left Eye Near:    Bilateral Near:     Physical Exam Constitutional:      Appearance: Normal appearance. He is normal weight.  HENT:     Head: Normocephalic.     Nose: Nose normal.  Eyes:     Extraocular Movements: Extraocular movements intact.  Pulmonary:     Effort: Pulmonary effort is normal.  Musculoskeletal:        General: Normal range of motion.     Cervical back: Normal range of motion.  Skin:    General:  Skin is warm and dry.  Neurological:     General: No focal deficit present.     Mental Status: He is alert and oriented to person, place, and time. Mental status is at baseline.  Psychiatric:        Mood and Affect: Mood normal.        Behavior: Behavior normal.        Thought Content: Thought content normal.        Judgment: Judgment normal.      UC Treatments / Results  Labs (all labs ordered are listed, but only abnormal results are displayed) Labs Reviewed - No data to display  EKG   Radiology No results found.  Procedures Procedures (including critical care time)  Medications Ordered in UC Medications - No data to display  Initial Impression / Assessment and Plan / UC Course  I have reviewed the triage vital signs and the nursing notes.  Pertinent labs & imaging results that were available during my care of the patient were reviewed by me and considered in my medical decision making (see chart for details).  Routine screening for sti  1. Lab results pending -will treat per  protocol 2. Advised abstinence until labs result and/or treatment complete Final Clinical Impressions(s) / UC Diagnoses   Final diagnoses:  Routine screening for STI (sexually transmitted infection)     Discharge Instructions     Lab results pending 2-3 days, will be called if positive for treatment  Do not have sex until labs result, if positive do not have sex until treatment is complete, if positive please notify partners      ED Prescriptions    None     PDMP not reviewed this encounter.   Valinda Hoar, [place] 08/14/20 [phone]

## 2020-08-14 NOTE — ED Triage Notes (Signed)
Pt presents for STD testing after recent exposure.

## 2020-08-14 NOTE — Discharge Instructions (Addendum)
Lab results pending 2-3 days, will be called if positive for treatment  Do not have sex until labs result, if positive do not have sex until treatment is complete, if positive please notify partners

## 2020-08-15 LAB — CYTOLOGY, (ORAL, ANAL, URETHRAL) ANCILLARY ONLY
Chlamydia: NEGATIVE
Comment: NEGATIVE
Comment: NEGATIVE
Comment: NORMAL
Neisseria Gonorrhea: NEGATIVE
Trichomonas: NEGATIVE

## 2020-09-06 ENCOUNTER — Ambulatory Visit (HOSPITAL_COMMUNITY)
Admission: EM | Admit: 2020-09-06 | Discharge: 2020-09-06 | Disposition: A | Discharge status: Home / Self Care | Payer: 59 | Attending: Emergency Medicine | Admitting: Emergency Medicine

## 2020-09-06 ENCOUNTER — Encounter (HOSPITAL_COMMUNITY): Payer: Self-pay

## 2020-09-06 ENCOUNTER — Other Ambulatory Visit: Payer: Self-pay

## 2020-09-06 DIAGNOSIS — M545 Low back pain, unspecified: Secondary | ICD-10-CM | POA: Diagnosis present

## 2020-09-06 DIAGNOSIS — R369 Urethral discharge, unspecified: Secondary | ICD-10-CM | POA: Diagnosis not present

## 2020-09-06 DIAGNOSIS — Z113 Encounter for screening for infections with a predominantly sexual mode of transmission: Secondary | ICD-10-CM | POA: Diagnosis not present

## 2020-09-06 LAB — POCT URINALYSIS DIPSTICK, ED / UC
Bilirubin Urine: NEGATIVE
Glucose, UA: NEGATIVE mg/dL
Hgb urine dipstick: NEGATIVE
Leukocytes,Ua: NEGATIVE
Nitrite: NEGATIVE
Protein, ur: NEGATIVE mg/dL
Specific Gravity, Urine: >=1.03 (ref 1.005–1.030)
Urobilinogen, UA: 0.2 mg/dL (ref 0.0–1.0)
pH: 5.5 (ref 5.0–8.0)

## 2020-09-06 LAB — HIV ANTIBODY (ROUTINE TESTING W REFLEX): HIV Screen 4th Generation wRfx: NONREACTIVE

## 2020-09-06 NOTE — Discharge Instructions (Signed)
You can take Ibuprofen or Naproxen (Advil or Aleve) as needed for lower back pain.  Make sure you use good body mechanics (lifting with your legs, etc) to prevent back injury- I have included additional information on preventing back injuries.   Make sure you are drinking lots of fluids, especially water.    We will contact you with the results from your lab work and any additional treatment.    Do not have sex while taking undergoing treatment for STI.  Make sure that all of your partners get tested and treated.   Use a condom or other barrier method for all sexual encounters.    Return or go to the Emergency Department if symptoms worsen or do not improve in the next few days.

## 2020-09-06 NOTE — ED Triage Notes (Signed)
Pt reports white penile discharge x 2-3 days; lower back pain 2 weeks. Pt requested STD's testing.

## 2020-09-06 NOTE — ED Provider Notes (Signed)
[facility]    CSN: [id number] Arrival date & time: 09/06/20  1659      History   Chief Complaint Chief Complaint  Patient presents with  . Penile Discharge    HPI Michael Hurst is a 20 y.o. male.   Patient here for evaluation of penile discharge and lower back pain.  Patient reports minimal white discharge for the past few days.  Patient is sexually active and denies any known exposures to patients positive for STIs.  Denies any penile pain or swelling.  Denies any dysuria, urgency, or frequency.  Denies ever testing positive for an STI.  Also reports having lower back pain intermittently.  Reports having to do heavy lifting frequently at work. Denies any specific alleviating or aggravating factors.  Denies any fevers, chest pain, shortness of breath, N/V/D, numbness, tingling, weakness, abdominal pain, or headaches.   ROS: As per HPI, all other pertinent ROS negative   The history is provided by the patient.  Penile Discharge    History reviewed. No pertinent past medical history.  There are no problems to display for this patient.   History reviewed. No pertinent surgical history.     Home Medications    Prior to Admission medications   Medication Sig Start Date End Date Taking? Authorizing Provider  doxycycline (VIBRAMYCIN) 100 MG capsule Take 1 capsule (100 mg total) by mouth 2 (two) times daily. 02/12/20   Hall-Potvin, [place], PA-C  lisinopril-hydrochlorothiazide (ZESTORETIC) 20-12.5 MG tablet Take 1 tablet by mouth daily. Patient not taking: No sig reported 03/22/16   Bing Neighbors, FNP  meloxicam (MOBIC) 15 MG tablet Take 1 tablet (15 mg total) by mouth daily. 07/22/20   Bing Neighbors, FNP    Family History Family History  Problem Relation Age of Onset  . Hypertension Mother   . Hypertension Father     Social History Social History   Tobacco Use  . Smoking status: Never Smoker  . Smokeless tobacco: Never Used  Vaping Use  .  Vaping Use: Never used  Substance Use Topics  . Alcohol use: Yes    Comment: occ  . Drug use: No     Allergies   Patient has no known allergies.   Review of Systems Review of Systems  Genitourinary: Positive for penile discharge.  Musculoskeletal: Positive for back pain.  All other systems reviewed and are negative.    Physical Exam Triage Vital Signs ED Triage Vitals  Enc Vitals Group     BP 09/06/20 1731 (!) 153/68     Pulse Rate 09/06/20 1731 67     Resp 09/06/20 1731 18     Temp 09/06/20 1731 98.2 F (36.8 C)     Temp Source 09/06/20 1731 Oral     SpO2 09/06/20 1731 98 %     Weight --      Height --      Head Circumference --      Peak Flow --      Pain Score 09/06/20 1729 4     Pain Loc --      Pain Edu? --      Excl. in GC? --    No data found.  Updated Vital Signs BP (!) 153/68 (BP Location: Right Arm)   Pulse 67   Temp 98.2 F (36.8 C) (Oral)   Resp 18   SpO2 98%   Visual Acuity Right Eye Distance:   Left Eye Distance:   Bilateral Distance:  Right Eye Near:   Left Eye Near:    Bilateral Near:     Physical Exam Vitals and nursing note reviewed.  Constitutional:      General: He is not in acute distress.    Appearance: Normal appearance. He is not ill-appearing, toxic-appearing or diaphoretic.  HENT:     Head: Normocephalic and atraumatic.  Eyes:     Conjunctiva/sclera: Conjunctivae normal.  Cardiovascular:     Rate and Rhythm: Normal rate.     Pulses: Normal pulses.  Pulmonary:     Effort: Pulmonary effort is normal.  Abdominal:     General: Abdomen is flat.     Tenderness: There is no right CVA tenderness or left CVA tenderness.  Genitourinary:    Comments: declines Musculoskeletal:        General: Normal range of motion.     Cervical back: Normal and normal range of motion.     Thoracic back: Normal.     Lumbar back: Normal. No tenderness. Normal range of motion.  Skin:    General: Skin is warm and dry.  Neurological:      General: No focal deficit present.     Mental Status: He is alert and oriented to person, place, and time.  Psychiatric:        Mood and Affect: Mood normal.      UC Treatments / Results  Labs (all labs ordered are listed, but only abnormal results are displayed) Labs Reviewed  POCT URINALYSIS DIPSTICK, ED / UC - Abnormal; Notable for the following components:      Result Value   Ketones, ur TRACE (*)    All other components within normal limits  RPR  HIV ANTIBODY (ROUTINE TESTING W REFLEX)  CYTOLOGY, (ORAL, ANAL, URETHRAL) ANCILLARY ONLY    EKG   Radiology No results found.  Procedures Procedures (including critical care time)  Medications Ordered in UC Medications - No data to display  Initial Impression / Assessment and Plan / UC Course  I have reviewed the triage vital signs and the nursing notes.  Pertinent labs & imaging results that were available during my care of the patient were reviewed by me and considered in my medical decision making (see chart for details).     Penile Discharge, Screen for STD Self swab obtained, will treat based on results.  Obtain lab work for HIV and RPR.  Discussed safe sex practices including condom or other barrier method use.  Follow-up as needed.  Acute low back pain Assessment negative for red flags or concerns, most likely due to poor body mechanics and overuse.  Urinalysis with ketones but otherwise unremarkable with no signs of infection.  Can utilize NSAIDs as needed for pain relief.  May also use heat and/or ice for comfort.  Discussed proper body mechanics and ways to prevent back injury, including gentle stretching and exercises.  Follow-up as needed  Final Clinical Impressions(s) / UC Diagnoses   Final diagnoses:  Screen for STD (sexually transmitted disease)  Penile discharge  Acute midline low back pain without sciatica     Discharge Instructions     You can take Ibuprofen or Naproxen (Advil or Aleve) as  needed for lower back pain.  Make sure you use good body mechanics (lifting with your legs, etc) to prevent back injury- I have included additional information on preventing back injuries.   Make sure you are drinking lots of fluids, especially water.    We will contact you with the results  from your lab work and any additional treatment.    Do not have sex while taking undergoing treatment for STI.  Make sure that all of your partners get tested and treated.   Use a condom or other barrier method for all sexual encounters.    Return or go to the Emergency Department if symptoms worsen or do not improve in the next few days.      ED Prescriptions    None     PDMP not reviewed this encounter.   Ivette Loyal, NP 09/06/20 1759

## 2020-09-07 ENCOUNTER — Telehealth (HOSPITAL_COMMUNITY): Payer: Self-pay | Admitting: Emergency Medicine

## 2020-09-07 LAB — CYTOLOGY, (ORAL, ANAL, URETHRAL) ANCILLARY ONLY
Chlamydia: POSITIVE — AB
Comment: NEGATIVE
Comment: NEGATIVE
Comment: NORMAL
Neisseria Gonorrhea: NEGATIVE
Trichomonas: NEGATIVE

## 2020-09-07 LAB — RPR: RPR Ser Ql: NONREACTIVE

## 2020-09-07 MED ORDER — DOXYCYCLINE HYCLATE 100 MG PO CAPS: 100.0000 mg | ORAL_CAPSULE | Freq: Two times a day (BID) | ORAL | 0 refills | Status: AC

## 2020-09-16 ENCOUNTER — Ambulatory Visit (INDEPENDENT_AMBULATORY_CARE_PROVIDER_SITE_OTHER): Payer: 59

## 2020-09-16 ENCOUNTER — Encounter (HOSPITAL_COMMUNITY): Payer: Self-pay | Admitting: *Deleted

## 2020-09-16 ENCOUNTER — Ambulatory Visit (HOSPITAL_COMMUNITY)
Admission: EM | Admit: 2020-09-16 | Discharge: 2020-09-16 | Disposition: A | Discharge status: Home / Self Care | Payer: 59 | Attending: Urgent Care | Admitting: Urgent Care

## 2020-09-16 ENCOUNTER — Other Ambulatory Visit: Payer: Self-pay

## 2020-09-16 DIAGNOSIS — R059 Cough, unspecified: Secondary | ICD-10-CM | POA: Diagnosis not present

## 2020-09-16 DIAGNOSIS — R0602 Shortness of breath: Secondary | ICD-10-CM | POA: Diagnosis not present

## 2020-09-16 DIAGNOSIS — R519 Headache, unspecified: Secondary | ICD-10-CM | POA: Diagnosis not present

## 2020-09-16 MED ORDER — ACETAMINOPHEN 325 MG PO TABS: 650.0000 mg | ORAL_TABLET | Freq: Four times a day (QID) | ORAL | 0 refills | Status: DC | PRN

## 2020-09-16 MED ORDER — HYDROXYZINE HCL 25 MG PO TABS: 12.5000 mg | ORAL_TABLET | Freq: Three times a day (TID) | ORAL | 0 refills | Status: DC | PRN

## 2020-09-16 NOTE — ED Triage Notes (Signed)
Pt reports SHOB after playing basket ball and boxing with his brother. Pt also has a productive cough.

## 2020-09-16 NOTE — ED Provider Notes (Signed)
[facility]   MRN: [id number] DOB: 2001/01/28  Subjective:   Michael Hurst is a 20 y.o. male presenting for 1 week history of intermittent shortness of breath, coughing, headache.  Patient states that the shortness of breath can occur randomly but is also happening when he is more active for instance playing basketball or boxing with his brother.  He is also had more trouble with intermittent headaches, intermittent photophobia.  Denies confusion, weakness, dizziness, abdominal pain, chest pain.  Patient does have a history of hypertension but is not taking his blood pressure medications as previously prescribed.  He does not have a PCP.  Denies history of asthma, respiratory disorders.  He is not a smoker.  No current facility-administered medications for this encounter.  Current Outpatient Medications:  .  lisinopril-hydrochlorothiazide (ZESTORETIC) 20-12.5 MG tablet, Take 1 tablet by mouth daily. (Patient not taking: No sig reported), Disp: 90 tablet, Rfl: 3 .  meloxicam (MOBIC) 15 MG tablet, Take 1 tablet (15 mg total) by mouth daily., Disp: 30 tablet, Rfl: 0   No Known Allergies  History reviewed. No pertinent past medical history.   History reviewed. No pertinent surgical history.  Family History  Problem Relation Age of Onset  . Hypertension Mother   . Hypertension Father     Social History   Tobacco Use  . Smoking status: Never Smoker  . Smokeless tobacco: Never Used  Vaping Use  . Vaping Use: Never used  Substance Use Topics  . Alcohol use: Yes    Comment: occ  . Drug use: No    ROS   Objective:   Vitals: BP (!) 141/91 (BP Location: Left Arm)   Pulse 67   Temp 98.1 F (36.7 C) (Oral)   Resp 18   SpO2 98%   BP Readings from Last 3 Encounters:  09/16/20 (!) 141/91  09/06/20 (!) 153/68  08/14/20 (!) 147/85   Physical Exam Constitutional:      General: He is not in acute distress.    Appearance: Normal appearance. He is  well-developed and normal weight. He is not ill-appearing, toxic-appearing or diaphoretic.  HENT:     Head: Normocephalic and atraumatic.     Right Ear: Tympanic membrane, ear canal and external ear normal. There is no impacted cerumen.     Left Ear: Tympanic membrane, ear canal and external ear normal. There is no impacted cerumen.     Nose: Nose normal. No congestion or rhinorrhea.     Mouth/Throat:     Mouth: Mucous membranes are moist.     Pharynx: Oropharynx is clear. No oropharyngeal exudate or posterior oropharyngeal erythema.  Eyes:     General: No scleral icterus.       Right eye: No discharge.        Left eye: No discharge.     Extraocular Movements: Extraocular movements intact.     Conjunctiva/sclera: Conjunctivae normal.     Pupils: Pupils are equal, round, and reactive to light.  Cardiovascular:     Rate and Rhythm: Normal rate and regular rhythm.     Heart sounds: Normal heart sounds. No murmur heard. No friction rub. No gallop.   Pulmonary:     Effort: Pulmonary effort is normal. No respiratory distress.     Breath sounds: Normal breath sounds. No stridor. No wheezing, rhonchi or rales.  Musculoskeletal:     Cervical back: Normal range of motion and neck supple. No rigidity. No muscular tenderness.  Skin:  General: Skin is warm and dry.  Neurological:     General: No focal deficit present.     Mental Status: He is alert and oriented to person, place, and time.     Cranial Nerves: No cranial nerve deficit.     Motor: No weakness.     Coordination: Coordination normal.     Gait: Gait normal.     Deep Tendon Reflexes: Reflexes normal.  Psychiatric:        Mood and Affect: Mood normal.        Behavior: Behavior normal.        Thought Content: Thought content normal.        Judgment: Judgment normal.     DG Chest 2 View  Result Date: 09/16/2020 CLINICAL DATA:  Shortness of breath.  Productive cough. EXAM: CHEST - 2 VIEW COMPARISON:  None. FINDINGS: The heart  size and mediastinal contours are within normal limits. Both lungs are clear. The visualized skeletal structures are unremarkable. IMPRESSION: No active cardiopulmonary disease. Electronically Signed   By: Gerome Sam III M.D   On: 09/16/2020 14:42    ED ECG REPORT   Date: 09/16/2020  Rate: 59bpm  Rhythm: sinus bradycardia  QRS Axis: left  Intervals: normal  ST/T Wave abnormalities: normal  Conduction Disutrbances:RSR prime or QR pattern in V1 suggestive of right ventricular conduction delay  Narrative Interpretation: Sinus bradycardia 59 bpm with nonspecific RSR prime or acute pattern in V1 as above.  Very comparable to previous EKG.  Old EKG Reviewed: changes noted  I have personally reviewed the EKG tracing and agree with the computerized printout as noted.   Assessment and Plan :   PDMP not reviewed this encounter.  1. Shortness of breath   2. Acute nonintractable headache, unspecified headache type     Michael Hurst is a 20 year old male with pmh of HTN presenting for 5-day history of persistent undifferentiated shortness of breath, headaches.  Patient does not have a Q wave or T wave in lead III, he does have an S wave in lead I.  Ultimately he has low risk factors for pulmonary embolism and we did discuss this.  Recommended further evaluation should his shortness of breath persist in the emergency room.  Otherwise he has a clear cardiopulmonary exam.  Recommended conservative management including Tylenol for his headaches.  Hydroxyzine to address possible anxiety.  Emphasized need to manage hypertension through medical therapy and healthy diet the patient refused.  He also refused a COVID-19 test. Counseled patient on potential for adverse effects with medications prescribed/recommended today, ER and return-to-clinic precautions discussed, patient verbalized understanding.    Wallis Bamberg, PA-C 09/16/20 1504

## 2020-10-25 ENCOUNTER — Ambulatory Visit (HOSPITAL_COMMUNITY)
Admission: EM | Admit: 2020-10-25 | Discharge: 2020-10-25 | Disposition: A | Discharge status: Home / Self Care | Payer: 59 | Attending: Emergency Medicine | Admitting: Emergency Medicine

## 2020-10-25 ENCOUNTER — Encounter (HOSPITAL_COMMUNITY): Payer: Self-pay

## 2020-10-25 ENCOUNTER — Other Ambulatory Visit: Payer: Self-pay

## 2020-10-25 DIAGNOSIS — Z202 Contact with and (suspected) exposure to infections with a predominantly sexual mode of transmission: Secondary | ICD-10-CM | POA: Diagnosis present

## 2020-10-25 DIAGNOSIS — R369 Urethral discharge, unspecified: Secondary | ICD-10-CM

## 2020-10-25 MED ORDER — CEFTRIAXONE SODIUM 500 MG IJ SOLR
INTRAMUSCULAR | Status: AC
  Filled 2020-10-25: qty 500

## 2020-10-25 MED ORDER — CEFTRIAXONE SODIUM 500 MG IJ SOLR
500.0000 mg | Freq: Once | INTRAMUSCULAR | Status: AC
  Administered 2020-10-25: 500 mg via INTRAMUSCULAR

## 2020-10-25 MED ORDER — LIDOCAINE HCL (PF) 1 % IJ SOLN
INTRAMUSCULAR | Status: AC
  Filled 2020-10-25: qty 2

## 2020-10-25 MED ORDER — AZITHROMYCIN 250 MG PO TABS
ORAL_TABLET | ORAL | Status: AC
  Filled 2020-10-25: qty 4

## 2020-10-25 MED ORDER — AZITHROMYCIN 250 MG PO TABS
1000.0000 mg | ORAL_TABLET | Freq: Once | ORAL | Status: AC
  Administered 2020-10-25: 1000 mg via ORAL

## 2020-10-25 NOTE — ED Provider Notes (Signed)
SUBJECTIVE:  Michael Hurst is a very pleasant 20 y.o. Male presents with concern for STD due to penile discharge over the last 2 days. Patient reports clear discharge. No purulence, yellow, or green thick discharge. No penile itching, burning, bumps, or rashes. No testicular pain, abdominal pain, fever, chills, or vomiting. No known STD exposure, but has been sexually active with two new male partners.  ROS: General/Constitutional: No fever, chills, or sweats GI: No abdominal pain, nausea/vomiting or diarrhea GU: As above Genitalia: As above Lymph: No swelling, red streaks or swollen lymph nodes Skin: No rashes or skin lesions   OBJECTIVE: BP (!) 118/57 (BP Location: Right Arm)   Pulse 76   Temp 98.5 F (36.9 C) (Oral)   Resp 18   SpO2 100%     General: Appears well-developed and well-nourished. No acute distress.  Cardiovascular: Normal rate Pulm/Chest: No respiratory distress Neurological: Alert and oriented to person, place, and time.  Skin: Skin is warm and dry.  Psychiatric: Normal mood, affect, behavior, and thought content.  Genitalia: Deferred secondary to self collect specimen  Laboratory:  No orders of the defined types were placed in this encounter.  No results found for any visits on 10/25/20.   Assessment:   ICD-10-CM   1. Possible exposure to STD  Z20.2     Plan: Possible exposure to STD   If new medications were prescribed and/or administered during this visit Instructions about new medications and side effects provided.  If any changes in chronic medications then new reconciled medication list is given to patient.    MDM: Patient presents with concern for STD due to penile discharge over the last 2 days. Patient reports clear discharge. No purulence, yellow, or green thick discharge. No penile itching, burning, bumps, or rashes. No testicular pain, abdominal pain, fever, chills, or vomiting. No known STD exposure, but has been sexually active with two  new male partners. Chart review completed. Urine STD screening obtained, patient treated in clinic with rocephin and azithromycin. Advised we would call with any positive results that require additional treatment. Patient declined HIV/syphilis testing today. Return as needed. Stable on discharge. Patient verbalized understanding and agreed with plan.  Instructions:    Discharge Instructions     Your testing has been sent to the lab.  If your testing results as positive, you will be notified via phone and we will initiate treatment at that time.  If you do not receive a phone call from [place] within the next 2 to 3 days, check your MyChart for updated health information. Do not engage in sex until you know your results. If you test positive you will need to abstain from sex for 7 days and notify all partners.       A copy of these instructions have been given to the patient or responsible adult who demonstrated the ability to learn, asked appropriate questions, and verbalized understanding of the plan of care.  There were no barriers to learning identified.  Amalia Greenhouse, FNP-C 10/25/20   This note was partially made with the aid of speech-to-text dictation; typographical errors are not intentional.   Amalia Greenhouse, FNP 10/25/20 1527

## 2020-10-25 NOTE — ED Triage Notes (Signed)
Pt presents for STD testing after having penile discharge X 2 days.

## 2020-10-25 NOTE — Discharge Instructions (Addendum)
Your testing has been sent to the lab.  If your testing results as positive, you will be notified via phone and we will initiate treatment at that time.  If you do not receive a phone call from [place] within the next 2 to 3 days, check your MyChart for updated health information. Do not engage in sex until you know your results. If you test positive you will need to abstain from sex for 7 days and notify all partners.

## 2020-10-26 LAB — CYTOLOGY, (ORAL, ANAL, URETHRAL) ANCILLARY ONLY
Chlamydia: NEGATIVE
Comment: NEGATIVE
Comment: NEGATIVE
Comment: NORMAL
Neisseria Gonorrhea: NEGATIVE
Trichomonas: NEGATIVE

## 2021-01-18 ENCOUNTER — Encounter (HOSPITAL_COMMUNITY): Payer: Self-pay | Admitting: Emergency Medicine

## 2021-01-18 ENCOUNTER — Ambulatory Visit (INDEPENDENT_AMBULATORY_CARE_PROVIDER_SITE_OTHER): Payer: 59

## 2021-01-18 ENCOUNTER — Ambulatory Visit (HOSPITAL_COMMUNITY)
Admission: EM | Admit: 2021-01-18 | Discharge: 2021-01-18 | Disposition: A | Discharge status: Home / Self Care | Payer: 59 | Attending: Family Medicine | Admitting: Family Medicine

## 2021-01-18 ENCOUNTER — Other Ambulatory Visit: Payer: Self-pay

## 2021-01-18 DIAGNOSIS — M25531 Pain in right wrist: Secondary | ICD-10-CM | POA: Insufficient documentation

## 2021-01-18 DIAGNOSIS — Z113 Encounter for screening for infections with a predominantly sexual mode of transmission: Secondary | ICD-10-CM | POA: Diagnosis not present

## 2021-01-18 DIAGNOSIS — S6991XA Unspecified injury of right wrist, hand and finger(s), initial encounter: Secondary | ICD-10-CM | POA: Diagnosis not present

## 2021-01-18 MED ORDER — NAPROXEN 500 MG PO TABS: 500.0000 mg | ORAL_TABLET | Freq: Two times a day (BID) | ORAL | 0 refills | Status: DC

## 2021-01-18 NOTE — Discharge Instructions (Addendum)
Your x-ray was normal.  Use wrist brace for the next couple of days to help with the healing process.  I also recommend that you use rest, ice, elevation for additional symptom relief.  You can use Naprosyn for pain relief.

## 2021-01-18 NOTE — ED Provider Notes (Signed)
[facility]    CSN: [id number] Arrival date & time: 01/18/21  1026      History   Chief Complaint Chief Complaint  Patient presents with   Wrist Pain    HPI Michael Hurst is a 20 y.o. male.   Patient presents today with a 2-day history of right wrist pain following injury.  Reports that he was playing basketball when he hit the ulnar side of his right wrist and has had pain since that time.  Pain is rated 5 on a 0-10 pain scale, localized to ulnar right wrist with radiation into forearm, described as aching, worse with certain movements or palpation, no alleviating factors identified.  He has tried compression, ice, ibuprofen with improvement but not resolution of symptoms.  He is right-handed.  He denies numbness, tingling, weakness.  Denies previous injury or surgery on this wrist.  In addition, patient is interested in STI testing.  He denies any known exposure.  Denies any symptoms including penile discharge, dysuria, abdominal pain, pelvic pain.   History reviewed. No pertinent past medical history.  There are no problems to display for this patient.   History reviewed. No pertinent surgical history.     Home Medications    Prior to Admission medications   Medication Sig Start Date End Date Taking? Authorizing Provider  naproxen (NAPROSYN) 500 MG tablet Take 1 tablet (500 mg total) by mouth 2 (two) times daily. 01/18/21  Yes Burke Terry, Noberto Retort, PA-C  acetaminophen (TYLENOL) 325 MG tablet Take 2 tablets (650 mg total) by mouth every 6 (six) hours as needed. 09/16/20   Wallis Bamberg, PA-C  hydrOXYzine (ATARAX/VISTARIL) 25 MG tablet Take 0.5-1 tablets (12.5-25 mg total) by mouth every 8 (eight) hours as needed. 09/16/20   Wallis Bamberg, PA-C  lisinopril-hydrochlorothiazide (ZESTORETIC) 20-12.5 MG tablet Take 1 tablet by mouth daily. Patient not taking: No sig reported 03/22/16   Bing Neighbors, FNP    Family History Family History  Problem Relation Age of Onset    Hypertension Mother    Hypertension Father     Social History Social History   Tobacco Use   Smoking status: Never   Smokeless tobacco: Never  Vaping Use   Vaping Use: Never used  Substance Use Topics   Alcohol use: Yes    Comment: occ   Drug use: No     Allergies   Patient has no known allergies.   Review of Systems Review of Systems  Constitutional:  Positive for activity change. Negative for appetite change, fatigue and fever.  Respiratory:  Negative for cough and shortness of breath.   Cardiovascular:  Negative for chest pain.  Gastrointestinal:  Negative for abdominal pain, diarrhea, nausea and vomiting.  Genitourinary:  Negative for dysuria, frequency, penile discharge and urgency.  Musculoskeletal:  Positive for arthralgias. Negative for myalgias.  Neurological:  Negative for dizziness, weakness, light-headedness and headaches.    Physical Exam Triage Vital Signs ED Triage Vitals  Enc Vitals Group     BP 01/18/21 1216 136/73     Pulse Rate 01/18/21 1216 88     Resp 01/18/21 1216 20     Temp 01/18/21 1216 98 F (36.7 C)     Temp Source 01/18/21 1216 Oral     SpO2 01/18/21 1216 98 %     Weight --      Height --      Head Circumference --      Peak Flow --  Pain Score 01/18/21 1212 5     Pain Loc --      Pain Edu? --      Excl. in GC? --    No data found.  Updated Vital Signs BP 136/73 (BP Location: Right Arm)   Pulse 88   Temp 98 F (36.7 C) (Oral)   Resp 20   SpO2 98%   Visual Acuity Right Eye Distance:   Left Eye Distance:   Bilateral Distance:    Right Eye Near:   Left Eye Near:    Bilateral Near:     Physical Exam Vitals reviewed.  Constitutional:      General: He is awake.     Appearance: Normal appearance. He is normal weight. He is not ill-appearing.     Comments: Very pleasant male appears stated age in no acute distress sitting comfortably in exam room  HENT:     Head: Normocephalic and atraumatic.  Cardiovascular:      Rate and Rhythm: Normal rate and regular rhythm.     Pulses:          Radial pulses are 2+ on the right side and 2+ on the left side.     Heart sounds: Normal heart sounds, S1 normal and S2 normal. No murmur heard. Pulmonary:     Effort: Pulmonary effort is normal.     Breath sounds: Normal breath sounds. No stridor. No wheezing, rhonchi or rales.     Comments: Clear to auscultation bilaterally Musculoskeletal:     Right wrist: Tenderness and bony tenderness present. No swelling, deformity or snuff box tenderness. Decreased range of motion.     Right hand: No swelling or bony tenderness. Normal range of motion. Normal strength. There is no disruption of two-point discrimination.  Neurological:     Mental Status: He is alert.  Psychiatric:        Behavior: Behavior is cooperative.     UC Treatments / Results  Labs (all labs ordered are listed, but only abnormal results are displayed) Labs Reviewed  CYTOLOGY, (ORAL, ANAL, URETHRAL) ANCILLARY ONLY    EKG   Radiology DG Wrist Complete Right  Result Date: 01/18/2021 CLINICAL DATA:  Wrist injury yesterday.  Pain EXAM: RIGHT WRIST - COMPLETE 3+ VIEW COMPARISON:  None. FINDINGS: There is no evidence of fracture or dislocation. There is no evidence of arthropathy or other focal bone abnormality. Soft tissues are unremarkable. IMPRESSION: Negative. Electronically Signed   By: Marlan Palau M.D.   On: 01/18/2021 12:57    Procedures Procedures (including critical care time)  Medications Ordered in UC Medications - No data to display  Initial Impression / Assessment and Plan / UC Course  I have reviewed the triage vital signs and the nursing notes.  Pertinent labs & imaging results that were available during my care of the patient were reviewed by me and considered in my medical decision making (see chart for details).      X-ray was obtained which showed no acute osseous abnormality.  Patient was placed in brace for comfort and  stability.  He was instructed to use conservative treatment measures including RICE protocol for symptom relief.  He was prescribed Naprosyn to be used twice daily as needed with instruction not to take additional NSAIDs with this medication due to risk of GI bleeding.  He can use Tylenol for breakthrough pain.  He was given contact information for sports medicine provider encouraged to follow-up with them if symptoms or not improving within a  few days.  Discussed alarm symptoms that warrant emergent evaluation.  Strict return precautions given to which patient expressed understanding.  We will contact patient with STI swab results if treatment is needed.  He was encouraged to follow his results on MyChart.  Final Clinical Impressions(s) / UC Diagnoses   Final diagnoses:  Right wrist pain  Injury of right wrist, initial encounter  Routine screening for STI (sexually transmitted infection)     Discharge Instructions      Your x-ray was normal.  Use wrist brace for the next couple of days to help with the healing process.  I also recommend that you use rest, ice, elevation for additional symptom relief.  You can use Naprosyn for pain relief.     ED Prescriptions     Medication Sig Dispense Auth. Provider   naproxen (NAPROSYN) 500 MG tablet Take 1 tablet (500 mg total) by mouth 2 (two) times daily. 30 tablet Dalphine Cowie, Noberto Retort, PA-C      PDMP not reviewed this encounter.   Jeani Hawking, PA-C 01/18/21 1337

## 2021-01-18 NOTE — ED Triage Notes (Signed)
Pent has right wrist pain after hitting it on goal post, while playing basketball.  Right radial pulse 2 +, able to move all fingers on right hand, brisk cap refill    Requesting STD check while he is here.  Denies symptoms

## 2021-01-23 LAB — CYTOLOGY, (ORAL, ANAL, URETHRAL) ANCILLARY ONLY
Chlamydia: NEGATIVE
Comment: NEGATIVE
Comment: NEGATIVE
Comment: NORMAL
Neisseria Gonorrhea: NEGATIVE
Trichomonas: NEGATIVE

## 2021-02-07 ENCOUNTER — Encounter (HOSPITAL_COMMUNITY): Payer: Self-pay | Admitting: Emergency Medicine

## 2021-02-07 ENCOUNTER — Ambulatory Visit (HOSPITAL_COMMUNITY)
Admission: EM | Admit: 2021-02-07 | Discharge: 2021-02-07 | Disposition: A | Discharge status: Home / Self Care | Payer: 59 | Attending: Family Medicine | Admitting: Family Medicine

## 2021-02-07 ENCOUNTER — Other Ambulatory Visit: Payer: Self-pay

## 2021-02-07 DIAGNOSIS — R059 Cough, unspecified: Secondary | ICD-10-CM | POA: Insufficient documentation

## 2021-02-07 DIAGNOSIS — R062 Wheezing: Secondary | ICD-10-CM | POA: Insufficient documentation

## 2021-02-07 DIAGNOSIS — Z113 Encounter for screening for infections with a predominantly sexual mode of transmission: Secondary | ICD-10-CM | POA: Diagnosis present

## 2021-02-07 LAB — HIV ANTIBODY (ROUTINE TESTING W REFLEX): HIV Screen 4th Generation wRfx: NONREACTIVE

## 2021-02-07 MED ORDER — PREDNISONE 20 MG PO TABS: 40.0000 mg | ORAL_TABLET | Freq: Every day | ORAL | 0 refills | Status: DC

## 2021-02-07 NOTE — ED Triage Notes (Addendum)
Patient c/o productive cough w/ "yellow" sputum x 1 week.   Patient denies fever or SOB.   Patient took an at home COVID test with negative results.   Patient has used OTC cough medication with some relief of symptoms while taking medications.   Patient wants STD testing.   Patient just wants HIV and Syphilis testing.

## 2021-02-07 NOTE — Discharge Instructions (Signed)
You have had labs (blood work) drawn today. We will call you with any significant abnormalities or if there is need to begin or change treatment or pursue further follow up.  You may also review your test results online through MyChart. If you do not have a MyChart account, instructions to sign up should be on your discharge paperwork.  

## 2021-02-08 LAB — RPR: RPR Ser Ql: NONREACTIVE

## 2021-02-10 NOTE — ED Provider Notes (Signed)
[facility]   [id number] 02/07/21 Arrival Time: 1752  ASSESSMENT & PLAN:  1. Cough   2. Wheezing   3. Screening for STDs (sexually transmitted diseases)    Discussed typical duration of viral illnesses. OTC symptom care as needed.  Begin: Meds ordered this encounter  Medications   predniSONE (DELTASONE) 20 MG tablet    Sig: Take 2 tablets (40 mg total) by mouth daily.    Dispense:  10 tablet    Refill:  0   HIV/RPR pending.    Discharge Instructions      You have had labs (blood work) drawn today. We will call you with any significant abnormalities or if there is need to begin or change treatment or pursue further follow up.  You may also review your test results online through MyChart. If you do not have a MyChart account, instructions to sign up should be on your discharge paperwork.       Follow-up Information     Schedule an appointment as soon as possible for a visit  with Aliene Beams, MD.   Specialty: Family Medicine Contact information: [address] [place] [address] [phone]                 Reviewed expectations re: course of current medical issues. Questions answered. Outlined signs and symptoms indicating need for more acute intervention. Understanding verbalized. After Visit Summary given.   SUBJECTIVE: History from: patient. Michael Hurst is a 20 y.o. male who reports dry cough with occas "yellow" production; past week. Afebrile. Neg COVID test at [facility]. OTC meds without relief. With wheezing at times. Denies: fever. Normal PO intake without n/v/d. Requests HIV/RPR testing.  OBJECTIVE:  Vitals:   02/07/21 1910  BP: (!) 164/83  Pulse: 60  Resp: 14  Temp: 99.2 F (37.3 C)  SpO2: 100%    General appearance: alert; no distress Eyes: PERRLA; EOMI; conjunctiva normal HENT: [place]; AT; with nasal congestion Neck: supple  Lungs: speaks full sentences without difficulty; unlabored; mild bilateral  wheezing Extremities: no edema Skin: warm and dry Neurologic: normal gait Psychological: alert and cooperative; normal mood and affect  Labs:  Labs Reviewed  RPR  HIV ANTIBODY (ROUTINE TESTING W REFLEX)     No Known Allergies  History reviewed. No pertinent past medical history. Social History   Socioeconomic History   Marital status: Single    Spouse name: Not on file   Number of children: Not on file   Years of education: Not on file   Highest education level: Not on file  Occupational History   Not on file  Tobacco Use   Smoking status: Never   Smokeless tobacco: Never  Vaping Use   Vaping Use: Never used  Substance and Sexual Activity   Alcohol use: Yes    Comment: occ   Drug use: No   Sexual activity: Yes    Birth control/protection: Condom, Other-see comments    Comment: not always  Other Topics Concern   Not on file  Social History Narrative   Not on file   Social Determinants of Health   Financial Resource Strain: Not on file  Food Insecurity: Not on file  Transportation Needs: Not on file  Physical Activity: Not on file  Stress: Not on file  Social Connections: Not on file  Intimate Partner Violence: Not on file   Family History  Problem Relation Age of Onset   Hypertension Mother    Hypertension Father  History reviewed. No pertinent surgical history.   Mardella Layman, MD 02/10/21 1008

## 2021-03-06 ENCOUNTER — Encounter (HOSPITAL_COMMUNITY): Payer: Self-pay

## 2021-03-06 ENCOUNTER — Other Ambulatory Visit: Payer: Self-pay

## 2021-03-06 ENCOUNTER — Ambulatory Visit (HOSPITAL_COMMUNITY)
Admission: EM | Admit: 2021-03-06 | Discharge: 2021-03-06 | Disposition: A | Discharge status: Home / Self Care | Payer: 59 | Attending: Emergency Medicine | Admitting: Emergency Medicine

## 2021-03-06 DIAGNOSIS — Z113 Encounter for screening for infections with a predominantly sexual mode of transmission: Secondary | ICD-10-CM

## 2021-03-06 DIAGNOSIS — R051 Acute cough: Secondary | ICD-10-CM | POA: Diagnosis present

## 2021-03-06 LAB — HIV ANTIBODY (ROUTINE TESTING W REFLEX): HIV Screen 4th Generation wRfx: NONREACTIVE

## 2021-03-06 MED ORDER — ALBUTEROL SULFATE HFA 108 (90 BASE) MCG/ACT IN AERS: 1.0000 | INHALATION_SPRAY | Freq: Four times a day (QID) | RESPIRATORY_TRACT | 0 refills | Status: DC | PRN

## 2021-03-06 MED ORDER — PREDNISONE 10 MG (21) PO TBPK: ORAL_TABLET | Freq: Every day | ORAL | 0 refills | Status: DC

## 2021-03-06 NOTE — Discharge Instructions (Signed)
Discussed coughs can linger for several weeks  Can take otc meds with a Claritin daily  Use humidifier  Use albuterol as needed  Std testing can take upto 3 days. Check my chart for results

## 2021-03-06 NOTE — ED Triage Notes (Signed)
Pt presents with a productive cough X 3 weeks. Pt states he has not gotten better. Pt also presents for STD testing.

## 2021-03-06 NOTE — ED Provider Notes (Signed)
[facility]    CSN: [id number] Arrival date & time: 03/06/21  0831      History   Chief Complaint Chief Complaint  Patient presents with   Cough    HPI Michael Hurst is a 20 y.o. male.   Pt is here for a cough that has been lingering for 3 weeks now. Denies any fever, no sob, no congestion or URI symptoms. Has not taken anything pta. Does not think that it is pneumonia or COVID. Has not taken anything pta.    Pt would like to have STD testing. Denies any sx no abd pain. He would also like the hiv testing and blood work. Does not have a pcp for this.    History reviewed. No pertinent past medical history.  There are no problems to display for this patient.   History reviewed. No pertinent surgical history.     Home Medications    Prior to Admission medications   Medication Sig Start Date End Date Taking? Authorizing Provider  albuterol (VENTOLIN HFA) 108 (90 Base) MCG/ACT inhaler Inhale 1-2 puffs into the lungs every 6 (six) hours as needed for wheezing or shortness of breath. 03/06/21  Yes Coralyn Mark, NP  predniSONE (STERAPRED UNI-PAK 21 TAB) 10 MG (21) TBPK tablet Take by mouth daily. Take 6 tabs by mouth daily  for 2 days, then 5 tabs for 2 days, then 4 tabs for 2 days, then 3 tabs for 2 days, 2 tabs for 2 days, then 1 tab by mouth daily for 2 days 03/06/21  Yes Coralyn Mark, NP  acetaminophen (TYLENOL) 325 MG tablet Take 2 tablets (650 mg total) by mouth every 6 (six) hours as needed. 09/16/20   Wallis Bamberg, PA-C  hydrOXYzine (ATARAX/VISTARIL) 25 MG tablet Take 0.5-1 tablets (12.5-25 mg total) by mouth every 8 (eight) hours as needed. 09/16/20   Wallis Bamberg, PA-C  lisinopril-hydrochlorothiazide (ZESTORETIC) 20-12.5 MG tablet Take 1 tablet by mouth daily. Patient not taking: No sig reported 03/22/16   Bing Neighbors, FNP  naproxen (NAPROSYN) 500 MG tablet Take 1 tablet (500 mg total) by mouth 2 (two) times daily. 01/18/21   Raspet, Noberto Retort,  PA-C  predniSONE (DELTASONE) 20 MG tablet Take 2 tablets (40 mg total) by mouth daily. 02/07/21   Mardella Layman, MD    Family History Family History  Problem Relation Age of Onset   Hypertension Mother    Hypertension Father     Social History Social History   Tobacco Use   Smoking status: Never   Smokeless tobacco: Never  Vaping Use   Vaping Use: Never used  Substance Use Topics   Alcohol use: Yes    Comment: occ   Drug use: No     Allergies   Patient has no known allergies.   Review of Systems Review of Systems  Constitutional:  Negative for chills and fever.  HENT:  Negative for congestion, postnasal drip, rhinorrhea, sinus pressure, sinus pain, sneezing and sore throat.   Respiratory:  Positive for cough. Negative for shortness of breath.   Cardiovascular: Negative.   Gastrointestinal: Negative.   Genitourinary: Negative.   Musculoskeletal: Negative.   Neurological: Negative.     Physical Exam Triage Vital Signs ED Triage Vitals  Enc Vitals Group     BP 03/06/21 0930 (!) 131/96     Pulse Rate 03/06/21 0930 65     Resp 03/06/21 0930 15     Temp 03/06/21 0930 98.9 F (37.2 C)  Temp Source 03/06/21 0930 Oral     SpO2 03/06/21 0930 98 %     Weight --      Height --      Head Circumference --      Peak Flow --      Pain Score 03/06/21 0929 0     Pain Loc --      Pain Edu? --      Excl. in GC? --    No data found.  Updated Vital Signs BP (!) 131/96 (BP Location: Left Arm)   Pulse 65   Temp 98.9 F (37.2 C) (Oral)   Resp 15   SpO2 98%   Visual Acuity Right Eye Distance:   Left Eye Distance:   Bilateral Distance:    Right Eye Near:   Left Eye Near:    Bilateral Near:     Physical Exam Constitutional:      Appearance: Normal appearance.  HENT:     Right Ear: Tympanic membrane normal.     Left Ear: Tympanic membrane normal.     Nose: Nose normal.  Eyes:     Pupils: Pupils are equal, round, and reactive to light.  Cardiovascular:      Rate and Rhythm: Normal rate.  Pulmonary:     Effort: Pulmonary effort is normal.  Abdominal:     General: Abdomen is flat.  Musculoskeletal:        General: Normal range of motion.     Cervical back: Normal range of motion.  Skin:    General: Skin is warm.  Neurological:     General: No focal deficit present.     Mental Status: He is alert.     UC Treatments / Results  Labs (all labs ordered are listed, but only abnormal results are displayed) Labs Reviewed  RPR  HIV ANTIBODY (ROUTINE TESTING W REFLEX)  CYTOLOGY, (ORAL, ANAL, URETHRAL) ANCILLARY ONLY    EKG   Radiology No results found.  Procedures Procedures (including critical care time)  Medications Ordered in UC Medications - No data to display  Initial Impression / Assessment and Plan / UC Course  I have reviewed the triage vital signs and the nursing notes.  Pertinent labs & imaging results that were available during my care of the patient were reviewed by me and considered in my medical decision making (see chart for details).     Discussed coughs can linger for several weeks  Can take otc meds with a Claritin daily  Use humidifier  Use albuterol as needed  Std testing can take upto 3 days. Check my chart for results   Final Clinical Impressions(s) / UC Diagnoses   Final diagnoses:  Acute cough  Screen for STD (sexually transmitted disease)     Discharge Instructions      Discussed coughs can linger for several weeks  Can take otc meds with a Claritin daily  Use humidifier  Use albuterol as needed  Std testing can take upto 3 days. Check my chart for results      ED Prescriptions     Medication Sig Dispense Auth. Provider   predniSONE (STERAPRED UNI-PAK 21 TAB) 10 MG (21) TBPK tablet Take by mouth daily. Take 6 tabs by mouth daily  for 2 days, then 5 tabs for 2 days, then 4 tabs for 2 days, then 3 tabs for 2 days, 2 tabs for 2 days, then 1 tab by mouth daily for 2 days 42 tablet  Coralyn Mark, NP  albuterol (VENTOLIN HFA) 108 (90 Base) MCG/ACT inhaler Inhale 1-2 puffs into the lungs every 6 (six) hours as needed for wheezing or shortness of breath. 90 g Coralyn Mark, NP      PDMP not reviewed this encounter.   Coralyn Mark, NP 03/06/21 1024

## 2021-03-07 LAB — RPR: RPR Ser Ql: NONREACTIVE

## 2021-03-07 LAB — CYTOLOGY, (ORAL, ANAL, URETHRAL) ANCILLARY ONLY
Chlamydia: NEGATIVE
Comment: NEGATIVE
Comment: NEGATIVE
Comment: NORMAL
Neisseria Gonorrhea: NEGATIVE
Trichomonas: NEGATIVE

## 2021-03-28 ENCOUNTER — Ambulatory Visit (HOSPITAL_COMMUNITY)
Admission: EM | Admit: 2021-03-28 | Discharge: 2021-03-28 | Disposition: A | Discharge status: Home / Self Care | Payer: 59 | Attending: Internal Medicine | Admitting: Internal Medicine

## 2021-03-28 ENCOUNTER — Encounter (HOSPITAL_COMMUNITY): Payer: Self-pay

## 2021-03-28 DIAGNOSIS — Z113 Encounter for screening for infections with a predominantly sexual mode of transmission: Secondary | ICD-10-CM

## 2021-03-28 DIAGNOSIS — R369 Urethral discharge, unspecified: Secondary | ICD-10-CM | POA: Diagnosis present

## 2021-03-28 NOTE — Discharge Instructions (Signed)
As we discussed, so we will contact you tomorrow with the results.  Please ensure that you are refraining from intercourse until you are aware of your results and if they come back positive that you refrain from intercourse until you are completely treated and let any partners know to be tested.

## 2021-03-28 NOTE — ED Triage Notes (Signed)
Pt reports white penile discharge x 3 days.   Pt requested STD's test.

## 2021-03-28 NOTE — ED Provider Notes (Signed)
[facility]    CSN: [id number] Arrival date & time: 03/28/21  0810      History   Chief Complaint Chief Complaint  Patient presents with   Penile Discharge    HPI Michael Hurst is a 20 y.o. male.   Penile Discharge  Reports penile discharge for the last 3 days Denies fevers, chills, abdominal pain, is otherwise feeling well States that he has had unprotected sex but is unaware of any specific STD exposures Declines HIV and RPR   History reviewed. No pertinent past medical history.  There are no problems to display for this patient.   History reviewed. No pertinent surgical history.     Home Medications    Prior to Admission medications   Medication Sig Start Date End Date Taking? Authorizing Provider  acetaminophen (TYLENOL) 325 MG tablet Take 2 tablets (650 mg total) by mouth every 6 (six) hours as needed. 09/16/20   Wallis Bamberg, PA-C  albuterol (VENTOLIN HFA) 108 (90 Base) MCG/ACT inhaler Inhale 1-2 puffs into the lungs every 6 (six) hours as needed for wheezing or shortness of breath. 03/06/21   Coralyn Mark, NP  hydrOXYzine (ATARAX/VISTARIL) 25 MG tablet Take 0.5-1 tablets (12.5-25 mg total) by mouth every 8 (eight) hours as needed. 09/16/20   Wallis Bamberg, PA-C  lisinopril-hydrochlorothiazide (ZESTORETIC) 20-12.5 MG tablet Take 1 tablet by mouth daily. Patient not taking: No sig reported 03/22/16   Bing Neighbors, FNP  naproxen (NAPROSYN) 500 MG tablet Take 1 tablet (500 mg total) by mouth 2 (two) times daily. 01/18/21   Raspet, Noberto Retort, PA-C  predniSONE (DELTASONE) 20 MG tablet Take 2 tablets (40 mg total) by mouth daily. 02/07/21   Mardella Layman, MD  predniSONE (STERAPRED UNI-PAK 21 TAB) 10 MG (21) TBPK tablet Take by mouth daily. Take 6 tabs by mouth daily  for 2 days, then 5 tabs for 2 days, then 4 tabs for 2 days, then 3 tabs for 2 days, 2 tabs for 2 days, then 1 tab by mouth daily for 2 days 03/06/21   Coralyn Mark, NP    Family  History Family History  Problem Relation Age of Onset   Hypertension Mother    Hypertension Father     Social History Social History   Tobacco Use   Smoking status: Never   Smokeless tobacco: Never  Vaping Use   Vaping Use: Never used  Substance Use Topics   Alcohol use: Yes    Comment: occ   Drug use: No     Allergies   Patient has no known allergies.   Review of Systems Review of Systems  All other systems reviewed and are negative. Per HPI  Physical Exam Triage Vital Signs ED Triage Vitals  Enc Vitals Group     BP 03/28/21 0859 (!) 141/87     Pulse Rate 03/28/21 0859 65     Resp 03/28/21 0859 18     Temp 03/28/21 0859 98.3 F (36.8 C)     Temp Source 03/28/21 0859 Oral     SpO2 03/28/21 0859 96 %     Weight --      Height --      Head Circumference --      Peak Flow --      Pain Score 03/28/21 0900 0     Pain Loc --      Pain Edu? --      Excl. in GC? --    No data found.  Updated Vital Signs BP (!) 141/87 (BP Location: Right Arm)   Pulse 65   Temp 98.3 F (36.8 C) (Oral)   Resp 18   SpO2 96%   Visual Acuity Right Eye Distance:   Left Eye Distance:   Bilateral Distance:    Right Eye Near:   Left Eye Near:    Bilateral Near:     Physical Exam Constitutional:      General: He is not in acute distress.    Appearance: He is not ill-appearing.  Cardiovascular:     Rate and Rhythm: Normal rate.  Pulmonary:     Effort: Pulmonary effort is normal. No respiratory distress.  Skin:    General: Skin is warm and dry.  Neurological:     Mental Status: He is oriented to person, place, and time.  Psychiatric:        Mood and Affect: Mood normal.        Behavior: Behavior normal.        Thought Content: Thought content normal.        Judgment: Judgment normal.     UC Treatments / Results  Labs (all labs ordered are listed, but only abnormal results are displayed) Labs Reviewed  CYTOLOGY, (ORAL, ANAL, URETHRAL) ANCILLARY ONLY     EKG   Radiology No results found.  Procedures Procedures (including critical care time)  Medications Ordered in UC Medications - No data to display  Initial Impression / Assessment and Plan / UC Course  I have reviewed the triage vital signs and the nursing notes.  Pertinent labs & imaging results that were available during my care of the patient were reviewed by me and considered in my medical decision making (see chart for details).     Urethral swab performed, will treat based on results.  Advised to abstain from intercourse.  Declines HIV/RPR.   Final Clinical Impressions(s) / UC Diagnoses   Final diagnoses:  Screen for sexually transmitted diseases  Penile discharge     Discharge Instructions      As we discussed, so we will contact you tomorrow with the results.  Please ensure that you are refraining from intercourse until you are aware of your results and if they come back positive that you refrain from intercourse until you are completely treated and let any partners know to be tested.     ED Prescriptions   None    PDMP not reviewed this encounter.   Nya Monds, Solmon Ice, DO 03/28/21 [phone]

## 2021-03-29 LAB — CYTOLOGY, (ORAL, ANAL, URETHRAL) ANCILLARY ONLY
Chlamydia: NEGATIVE
Comment: NEGATIVE
Comment: NEGATIVE
Comment: NORMAL
Neisseria Gonorrhea: NEGATIVE
Trichomonas: NEGATIVE

## 2021-05-09 ENCOUNTER — Encounter (HOSPITAL_COMMUNITY): Payer: Self-pay | Admitting: Emergency Medicine

## 2021-05-09 ENCOUNTER — Ambulatory Visit (HOSPITAL_COMMUNITY)
Admission: EM | Admit: 2021-05-09 | Discharge: 2021-05-09 | Disposition: A | Discharge status: Home / Self Care | Payer: 59 | Attending: Physician Assistant | Admitting: Physician Assistant

## 2021-05-09 ENCOUNTER — Other Ambulatory Visit: Payer: Self-pay

## 2021-05-09 DIAGNOSIS — M5441 Lumbago with sciatica, right side: Secondary | ICD-10-CM | POA: Diagnosis present

## 2021-05-09 DIAGNOSIS — Z113 Encounter for screening for infections with a predominantly sexual mode of transmission: Secondary | ICD-10-CM | POA: Diagnosis not present

## 2021-05-09 MED ORDER — TIZANIDINE HCL 4 MG PO CAPS: 4.0000 mg | ORAL_CAPSULE | Freq: Every evening | ORAL | 0 refills | Status: DC | PRN

## 2021-05-09 NOTE — ED Triage Notes (Signed)
Pt wanting STD check up denies s/s or exposure.  Also having right lower back pains for 2 weeks that is worse after playing basketball for 2 hours.

## 2021-05-09 NOTE — Discharge Instructions (Addendum)
Take Zanaflex at night as needed for muscle spasm.  This make you sleepy do not drive or drink alcohol while taking it.  You can use Tylenol and ibuprofen over-the-counter for symptom relief.  Use heat and stretch.  If your symptoms are not improving please follow-up with sports medicine as you may benefit from physical therapy or additional interventions.  If you develop any severe pain, difficulty walking, numbness in your legs, going to the bathroom on yourself without noticing it, trouble going to the bathroom you need to be seen immediately.  We will contact you with your swab results if we need to arrange any treatment.

## 2021-05-09 NOTE — ED Provider Notes (Signed)
[facility]    CSN: [id number] Arrival date & time: 05/09/21  0350      History   Chief Complaint Chief Complaint  Patient presents with   Back Pain   SEXUALLY TRANSMITTED DISEASE    HPI Michael Hurst is a 20 y.o. male.   Patient presents today with several concerns.  His primary concern today is a 2-week history of intermittent right lower back pain.  Reports that symptoms began after he was playing basketball and have been intermittent and triggered by physical activity including playing basketball since that time.  He reports pain is currently rated 2 on a 0-10 pain scale, localized to right lower back, described as pinching, no aggravating relieving factors identified.  He has tried [place] powders without improvement of symptoms.  Denies any previous back injury or surgery.  Denies history of malignancy or osteoporosis.  He denies any recent injury but does report change in activity likely triggered symptoms.  He denies any bowel/bladder incontinence, lower extremity weakness, saddle anesthesia.  He does report improvement when he rests for several days but if he goes back to playing basketball or being physically active symptoms return.  He is requesting STD panel.  He is sexually active with male partners and does not consistently use condoms.  He does report new sexual partners.  Denies any symptoms or known exposure.  Denies penile discharge, pelvic pain, abdominal pain, nausea, vomiting.  Denies any antibiotics in the past 3 months.   History reviewed. No pertinent past medical history.  There are no problems to display for this patient.   History reviewed. No pertinent surgical history.     Home Medications    Prior to Admission medications   Medication Sig Start Date End Date Taking? Authorizing Provider  tiZANidine (ZANAFLEX) 4 MG capsule Take 1 capsule (4 mg total) by mouth at bedtime as needed for muscle spasms. 05/09/21  Yes Zayvian Mcmurtry, Noberto Retort, PA-C   acetaminophen (TYLENOL) 325 MG tablet Take 2 tablets (650 mg total) by mouth every 6 (six) hours as needed. 09/16/20   Wallis Bamberg, PA-C  albuterol (VENTOLIN HFA) 108 (90 Base) MCG/ACT inhaler Inhale 1-2 puffs into the lungs every 6 (six) hours as needed for wheezing or shortness of breath. 03/06/21   Coralyn Mark, NP  hydrOXYzine (ATARAX/VISTARIL) 25 MG tablet Take 0.5-1 tablets (12.5-25 mg total) by mouth every 8 (eight) hours as needed. 09/16/20   Wallis Bamberg, PA-C  lisinopril-hydrochlorothiazide (ZESTORETIC) 20-12.5 MG tablet Take 1 tablet by mouth daily. Patient not taking: No sig reported 03/22/16   Bing Neighbors, FNP    Family History Family History  Problem Relation Age of Onset   Hypertension Mother    Hypertension Father     Social History Social History   Tobacco Use   Smoking status: Never   Smokeless tobacco: Never  Vaping Use   Vaping Use: Never used  Substance Use Topics   Alcohol use: Yes    Comment: occ   Drug use: No     Allergies   Patient has no known allergies.   Review of Systems Review of Systems  Constitutional:  Positive for activity change. Negative for appetite change, fatigue and fever.  Respiratory:  Negative for cough and shortness of breath.   Cardiovascular:  Negative for chest pain.  Gastrointestinal:  Negative for abdominal pain, diarrhea, nausea and vomiting.  Genitourinary:  Negative for dysuria, frequency, penile discharge, penile pain, penile swelling and urgency.  Musculoskeletal:  Positive  for back pain. Negative for arthralgias and myalgias.  Neurological:  Negative for dizziness, light-headedness and headaches.    Physical Exam Triage Vital Signs ED Triage Vitals  Enc Vitals Group     BP 05/09/21 0827 (!) 148/91     Pulse Rate 05/09/21 0827 68     Resp 05/09/21 0827 16     Temp 05/09/21 0827 98.1 F (36.7 C)     Temp Source 05/09/21 0827 Oral     SpO2 05/09/21 0827 98 %     Weight --      Height --      Head  Circumference --      Peak Flow --      Pain Score 05/09/21 0826 7     Pain Loc --      Pain Edu? --      Excl. in GC? --    No data found.  Updated Vital Signs BP (!) 148/91 (BP Location: Right Arm)    Pulse 68    Temp 98.1 F (36.7 C) (Oral)    Resp 16    SpO2 98%   Visual Acuity Right Eye Distance:   Left Eye Distance:   Bilateral Distance:    Right Eye Near:   Left Eye Near:    Bilateral Near:     Physical Exam Vitals reviewed.  Constitutional:      General: He is awake.     Appearance: Normal appearance. He is well-developed. He is not ill-appearing.     Comments: Very pleasant male appears stated age in no acute distress sitting comfortably in exam room  HENT:     Head: Normocephalic and atraumatic.     Mouth/Throat:     Pharynx: Uvula midline. No oropharyngeal exudate or posterior oropharyngeal erythema.  Cardiovascular:     Rate and Rhythm: Normal rate and regular rhythm.     Heart sounds: Normal heart sounds, S1 normal and S2 normal. No murmur heard. Pulmonary:     Effort: Pulmonary effort is normal.     Breath sounds: Normal breath sounds. No stridor. No wheezing, rhonchi or rales.     Comments: Clear to auscultation bilaterally Abdominal:     General: Bowel sounds are normal.     Palpations: Abdomen is soft.     Tenderness: There is no abdominal tenderness.     Comments: Benign abdominal exam  Genitourinary:    Comments: Exam deferred Musculoskeletal:     Cervical back: No tenderness or bony tenderness.     Thoracic back: No tenderness or bony tenderness.     Lumbar back: Spasms and tenderness present. No bony tenderness. Negative right straight leg raise test and negative left straight leg raise test.     Comments: Back: No pain percussion of vertebrae.  Mild tenderness palpation of right lumbar paraspinal muscles. No deformity or step-off.  Strength 5/5 bilateral lower extremity.   Neurological:     Mental Status: He is alert.  Psychiatric:         Behavior: Behavior is cooperative.     UC Treatments / Results  Labs (all labs ordered are listed, but only abnormal results are displayed) Labs Reviewed  CYTOLOGY, (ORAL, ANAL, URETHRAL) ANCILLARY ONLY    EKG   Radiology No results found.  Procedures Procedures (including critical care time)  Medications Ordered in UC Medications - No data to display  Initial Impression / Assessment and Plan / UC Course  I have reviewed the triage vital signs and the nursing  notes.  Pertinent labs & imaging results that were available during my care of the patient were reviewed by me and considered in my medical decision making (see chart for details).     No indication for plain films as patient has no bony tenderness and denies any recent trauma.  Suspect muscular etiology given clinical presentation.  He was prescribed Zanaflex to be used daily as needed.  Discussed that this medication is sedating and he should not drive or drink alcohol while taking it.  He can alternate Tylenol and ibuprofen over-the-counter for additional symptom relief.  Encouraged conservative treatment measures including heat, rest, stretch.  Discussed that if symptoms or not improving he should follow-up with sports medicine provider was given contact information for local group.  Recommended he avoid strenuous activity until symptoms have improved.  Discussed alarm symptoms that warrant emergent evaluation including increased pain, fever, difficulty ambulating, bowel/bladder incontinence or retention, saddle anesthesia, lower extremity weakness.  Strict turn precautions given to which he expressed understanding.  STI swab collected today-results pending.  Patient is currently asymptomatic so we will defer treatment until results are obtained.  Discussed the importance of safe sex practices.  Discussed alarm symptoms that warrant emergent evaluation.  Strict return precautions given to which he expressed  understanding.  Final Clinical Impressions(s) / UC Diagnoses   Final diagnoses:  Acute right-sided low back pain with right-sided sciatica  Routine screening for STI (sexually transmitted infection)     Discharge Instructions      Take Zanaflex at night as needed for muscle spasm.  This make you sleepy do not drive or drink alcohol while taking it.  You can use Tylenol and ibuprofen over-the-counter for symptom relief.  Use heat and stretch.  If your symptoms are not improving please follow-up with sports medicine as you may benefit from physical therapy or additional interventions.  If you develop any severe pain, difficulty walking, numbness in your legs, going to the bathroom on yourself without noticing it, trouble going to the bathroom you need to be seen immediately.  We will contact you with your swab results if we need to arrange any treatment.     ED Prescriptions     Medication Sig Dispense Auth. Provider   tiZANidine (ZANAFLEX) 4 MG capsule Take 1 capsule (4 mg total) by mouth at bedtime as needed for muscle spasms. 7 capsule Deone Omahoney K, PA-C      PDMP not reviewed this encounter.   Jeani Hawking, PA-C 05/09/21 [id number]

## 2021-05-10 LAB — CYTOLOGY, (ORAL, ANAL, URETHRAL) ANCILLARY ONLY
Chlamydia: NEGATIVE
Comment: NEGATIVE
Comment: NEGATIVE
Comment: NORMAL
Neisseria Gonorrhea: NEGATIVE
Trichomonas: NEGATIVE

## 2021-06-01 ENCOUNTER — Encounter (HOSPITAL_COMMUNITY): Payer: Self-pay | Admitting: *Deleted

## 2021-06-01 ENCOUNTER — Ambulatory Visit (HOSPITAL_COMMUNITY)
Admission: EM | Admit: 2021-06-01 | Discharge: 2021-06-01 | Disposition: A | Discharge status: Home / Self Care | Payer: 59 | Attending: Urgent Care | Admitting: Urgent Care

## 2021-06-01 ENCOUNTER — Other Ambulatory Visit: Payer: Self-pay

## 2021-06-01 DIAGNOSIS — Z7251 High risk heterosexual behavior: Secondary | ICD-10-CM | POA: Insufficient documentation

## 2021-06-01 DIAGNOSIS — Z8249 Family history of ischemic heart disease and other diseases of the circulatory system: Secondary | ICD-10-CM | POA: Insufficient documentation

## 2021-06-01 DIAGNOSIS — Z202 Contact with and (suspected) exposure to infections with a predominantly sexual mode of transmission: Secondary | ICD-10-CM | POA: Diagnosis not present

## 2021-06-01 DIAGNOSIS — I10 Essential (primary) hypertension: Secondary | ICD-10-CM | POA: Insufficient documentation

## 2021-06-01 MED ORDER — AMLODIPINE BESYLATE 5 MG PO TABS: 5.0000 mg | ORAL_TABLET | Freq: Every day | ORAL | 0 refills | Status: DC

## 2021-06-01 NOTE — Discharge Instructions (Signed)

## 2021-06-01 NOTE — ED Triage Notes (Signed)
Reports he is here just for a STD check up with no Sx's.

## 2021-06-01 NOTE — ED Provider Notes (Signed)
[place]   MRN: [id number] DOB: Nov 19, 2000  Subjective:   Michael Hurst is a 21 y.o. male presenting for STD check.  Patient has 1 sex partner, does not use condoms for protection. Denies dysuria, hematuria, urinary frequency, penile discharge, penile swelling, testicular pain, testicular swelling, anal pain, groin pain.  He did have an HIV and syphilis check this past October and has generally been negative for this.  Does not want to do this today.  He also has concerns about his high blood pressure.  Has an extensive family history of this.  Denies any headache, confusion, chest pain, weakness, numbness or tingling, belly pain, hematuria.  Has previously had to take blood pressure medication but stopped this on his own.  He did not follow-up with any PCP thereafter either.  He is not a smoker, no drug use.  No current facility-administered medications for this encounter.  Current Outpatient Medications:    acetaminophen (TYLENOL) 325 MG tablet, Take 2 tablets (650 mg total) by mouth every 6 (six) hours as needed., Disp: 30 tablet, Rfl: 0   albuterol (VENTOLIN HFA) 108 (90 Base) MCG/ACT inhaler, Inhale 1-2 puffs into the lungs every 6 (six) hours as needed for wheezing or shortness of breath., Disp: 90 g, Rfl: 0   hydrOXYzine (ATARAX/VISTARIL) 25 MG tablet, Take 0.5-1 tablets (12.5-25 mg total) by mouth every 8 (eight) hours as needed., Disp: 30 tablet, Rfl: 0   lisinopril-hydrochlorothiazide (ZESTORETIC) 20-12.5 MG tablet, Take 1 tablet by mouth daily. (Patient not taking: No sig reported), Disp: 90 tablet, Rfl: 3   tiZANidine (ZANAFLEX) 4 MG capsule, Take 1 capsule (4 mg total) by mouth at bedtime as needed for muscle spasms., Disp: 7 capsule, Rfl: 0   No Known Allergies  History reviewed. No pertinent past medical history.   History reviewed. No pertinent surgical history.  Family History  Problem Relation Age of Onset   Hypertension Mother    Hypertension  Father     Social History   Tobacco Use   Smoking status: Never   Smokeless tobacco: Never  Vaping Use   Vaping Use: Never used  Substance Use Topics   Alcohol use: Yes    Comment: occ   Drug use: No    ROS   Objective:   Vitals: BP (!) 154/79    Pulse 77    Temp 98.3 F (36.8 C)    Resp 18    SpO2 100%   BP Readings from Last 3 Encounters:  06/01/21 (!) 154/79  05/09/21 (!) 148/91  03/28/21 (!) 141/87   Physical Exam Constitutional:      General: He is not in acute distress.    Appearance: Normal appearance. He is well-developed. He is not ill-appearing, toxic-appearing or diaphoretic.  HENT:     Head: Normocephalic and atraumatic.     Right Ear: External ear normal.     Left Ear: External ear normal.     Nose: Nose normal.     Mouth/Throat:     Mouth: Mucous membranes are moist.  Eyes:     General: No scleral icterus.       Right eye: No discharge.        Left eye: No discharge.     Extraocular Movements: Extraocular movements intact.  Cardiovascular:     Rate and Rhythm: Normal rate and regular rhythm.     Heart sounds: Normal heart sounds. No murmur heard.   No friction rub. No gallop.  Pulmonary:  Effort: Pulmonary effort is normal. No respiratory distress.     Breath sounds: Normal breath sounds. No stridor. No wheezing, rhonchi or rales.  Neurological:     Mental Status: He is alert and oriented to person, place, and time.     Cranial Nerves: No cranial nerve deficit.     Motor: No weakness.     Coordination: Coordination normal.     Gait: Gait normal.     Comments: Negative Romberg and pronator drift.   Psychiatric:        Mood and Affect: Mood normal.        Behavior: Behavior normal.        Thought Content: Thought content normal.    Assessment and Plan :   PDMP not reviewed this encounter.  1. Unprotected sex   2. Essential hypertension   3. Family history of hypertension    General STI check pending.  Recommended restarting  amlodipine at 5 mg once daily.  He has extensive family history of hypertension and has had persistently elevated blood pressures.  Discussed hypertensive friendly diet.  Provided him with information to 2 different primary care providers that may be able to help him going forward. Counseled patient on potential for adverse effects with medications prescribed/recommended today, ER and return-to-clinic precautions discussed, patient verbalized understanding.    Jaynee Eagles, PA-C 06/01/21 1306

## 2021-06-04 LAB — CYTOLOGY, (ORAL, ANAL, URETHRAL) ANCILLARY ONLY
Chlamydia: NEGATIVE
Comment: NEGATIVE
Comment: NEGATIVE
Comment: NORMAL
Neisseria Gonorrhea: NEGATIVE
Trichomonas: NEGATIVE

## 2021-06-25 ENCOUNTER — Other Ambulatory Visit: Payer: Self-pay

## 2021-06-25 ENCOUNTER — Ambulatory Visit (HOSPITAL_COMMUNITY)
Admission: EM | Admit: 2021-06-25 | Discharge: 2021-06-25 | Disposition: A | Discharge status: Home / Self Care | Payer: 59 | Attending: Physician Assistant | Admitting: Physician Assistant

## 2021-06-25 ENCOUNTER — Encounter (HOSPITAL_COMMUNITY): Payer: Self-pay

## 2021-06-25 DIAGNOSIS — Z202 Contact with and (suspected) exposure to infections with a predominantly sexual mode of transmission: Secondary | ICD-10-CM | POA: Diagnosis not present

## 2021-06-25 DIAGNOSIS — Z113 Encounter for screening for infections with a predominantly sexual mode of transmission: Secondary | ICD-10-CM | POA: Insufficient documentation

## 2021-06-25 LAB — HEPATITIS PANEL, ACUTE
HCV Ab: NONREACTIVE
Hep A IgM: NONREACTIVE
Hep B C IgM: NONREACTIVE
Hepatitis B Surface Ag: NONREACTIVE

## 2021-06-25 LAB — HIV ANTIBODY (ROUTINE TESTING W REFLEX): HIV Screen 4th Generation wRfx: NONREACTIVE

## 2021-06-25 NOTE — ED Triage Notes (Signed)
Pt presents for STD testing with no Sx's.

## 2021-06-25 NOTE — ED Provider Notes (Signed)
[facility]    CSN: [id number] Arrival date & time: 06/25/21  1024      History   Chief Complaint Chief Complaint  Patient presents with   SEXUALLY TRANSMITTED DISEASE    HPI Michael Hurst is a 21 y.o. male.   Patient is here today for STD screening. He denies any symptoms concerning for same. He reports he is here for repeat testing because he does not trust the results of penile swab. He would like screening for HIV, Hepatitis and syphilis as well.   The history is provided by the patient.   History reviewed. No pertinent past medical history.  There are no problems to display for this patient.   History reviewed. No pertinent surgical history.     Home Medications    Prior to Admission medications   Medication Sig Start Date End Date Taking? Authorizing Provider  acetaminophen (TYLENOL) 325 MG tablet Take 2 tablets (650 mg total) by mouth every 6 (six) hours as needed. 09/16/20   Wallis Bamberg, PA-C  albuterol (VENTOLIN HFA) 108 (90 Base) MCG/ACT inhaler Inhale 1-2 puffs into the lungs every 6 (six) hours as needed for wheezing or shortness of breath. 03/06/21   Coralyn Mark, NP  amLODipine (NORVASC) 5 MG tablet Take 1 tablet (5 mg total) by mouth daily. 06/01/21   Wallis Bamberg, PA-C  hydrOXYzine (ATARAX/VISTARIL) 25 MG tablet Take 0.5-1 tablets (12.5-25 mg total) by mouth every 8 (eight) hours as needed. 09/16/20   Wallis Bamberg, PA-C  lisinopril-hydrochlorothiazide (ZESTORETIC) 20-12.5 MG tablet Take 1 tablet by mouth daily. Patient not taking: No sig reported 03/22/16   Bing Neighbors, FNP  tiZANidine (ZANAFLEX) 4 MG capsule Take 1 capsule (4 mg total) by mouth at bedtime as needed for muscle spasms. 05/09/21   Raspet, Noberto Retort, PA-C    Family History Family History  Problem Relation Age of Onset   Hypertension Mother    Hypertension Father     Social History Social History   Tobacco Use   Smoking status: Never   Smokeless tobacco: Never   Vaping Use   Vaping Use: Never used  Substance Use Topics   Alcohol use: Yes    Comment: occ   Drug use: No     Allergies   Patient has no known allergies.   Review of Systems Review of Systems  Constitutional:  Negative for chills and fever.  Eyes:  Negative for discharge and redness.  Gastrointestinal:  Negative for abdominal pain, nausea and vomiting.  Genitourinary:  Negative for genital sores and penile discharge.  Neurological:  Negative for numbness.    Physical Exam Triage Vital Signs ED Triage Vitals  Enc Vitals Group     BP      Pulse      Resp      Temp      Temp src      SpO2      Weight      Height      Head Circumference      Peak Flow      Pain Score      Pain Loc      Pain Edu?      Excl. in GC?    No data found.  Updated Vital Signs BP 126/83 (BP Location: Left Arm)    Pulse 61    Temp 98.3 F (36.8 C) (Oral)    Resp 17    SpO2 99%      Physical  Exam Vitals and nursing note reviewed.  Constitutional:      General: He is not in acute distress.    Appearance: Normal appearance. He is not ill-appearing.  HENT:     Head: Normocephalic and atraumatic.  Eyes:     Conjunctiva/sclera: Conjunctivae normal.  Cardiovascular:     Rate and Rhythm: Normal rate.  Pulmonary:     Effort: Pulmonary effort is normal.  Neurological:     Mental Status: He is alert.  Psychiatric:        Mood and Affect: Mood normal.        Behavior: Behavior normal.        Thought Content: Thought content normal.     UC Treatments / Results  Labs (all labs ordered are listed, but only abnormal results are displayed) Labs Reviewed  HIV ANTIBODY (ROUTINE TESTING W REFLEX)  HEPATITIS PANEL, ACUTE  RPR  CYTOLOGY, (ORAL, ANAL, URETHRAL) ANCILLARY ONLY    EKG   Radiology No results found.  Procedures Procedures (including critical care time)  Medications Ordered in UC Medications - No data to display  Initial Impression / Assessment and Plan / UC  Course  I have reviewed the triage vital signs and the nursing notes.  Pertinent labs & imaging results that were available during my care of the patient were reviewed by me and considered in my medical decision making (see chart for details).    STD screening ordered as requested. Recommended follow up with any further concerns, otherwise will await results for further recommendation.   Final Clinical Impressions(s) / UC Diagnoses   Final diagnoses:  Screen for STD (sexually transmitted disease)   Discharge Instructions   None    ED Prescriptions   None    PDMP not reviewed this encounter.   Tomi Bamberger, PA-C 06/25/21 1205

## 2021-06-26 LAB — CYTOLOGY, (ORAL, ANAL, URETHRAL) ANCILLARY ONLY
Chlamydia: NEGATIVE
Comment: NEGATIVE
Comment: NEGATIVE
Comment: NORMAL
Neisseria Gonorrhea: NEGATIVE
Trichomonas: NEGATIVE

## 2021-06-26 LAB — RPR: RPR Ser Ql: NONREACTIVE

## 2021-07-31 ENCOUNTER — Encounter (HOSPITAL_COMMUNITY): Payer: Self-pay

## 2021-07-31 ENCOUNTER — Other Ambulatory Visit: Payer: Self-pay

## 2021-07-31 ENCOUNTER — Ambulatory Visit (HOSPITAL_COMMUNITY)
Admission: EM | Admit: 2021-07-31 | Discharge: 2021-07-31 | Disposition: A | Discharge status: Home / Self Care | Payer: 59 | Attending: Emergency Medicine | Admitting: Emergency Medicine

## 2021-07-31 DIAGNOSIS — J029 Acute pharyngitis, unspecified: Secondary | ICD-10-CM | POA: Diagnosis present

## 2021-07-31 LAB — POCT RAPID STREP A, ED / UC: Streptococcus, Group A Screen (Direct): NEGATIVE

## 2021-07-31 NOTE — Discharge Instructions (Signed)
Your rapid strep test today was negative, your throat sample has been sent to the lab to see if it will grow bacteria, if this occurs you will be notified and antibiotic be sent in for use ? ?In the meantime we must treat this as a viral symptom and manage the symptoms ? ?You may attempt use of salt water gargles, over-the-counter Chloraseptic spray, throat lozenges, warm liquids, soft foods and teaspoons of honey for comfort ? ? ?May use ibuprofen every 8 hours as needed in addition to Tylenol for additional comfort ? ?You may follow-up at urgent care as needed  ?

## 2021-07-31 NOTE — ED Triage Notes (Signed)
Pt c/o cough and sore throat x2 days. Taking OTC throat lounges with little relief.  ?

## 2021-07-31 NOTE — ED Provider Notes (Signed)
?[facility] ? ? ? ?CSN: [id number] ?Arrival date & time: 07/31/21  1633 ? ? ?  ? ?History   ?Chief Complaint ?Chief Complaint  ?Patient presents with  ? Cough  ? Sore Throat  ? ? ?HPI ?Michael Hurst is a 21 y.o. male.  ? ?Patient presents with nonproductive cough and sore throat for 1 day.  Painful to swallow.  Decreased appetite but tolerating fluids.  No known sick contacts.  Has attempted use of Cepacol which is minimally helpful.  Denies fever, chills, body aches, congestion, shortness of breath, wheezing, abdominal pain, nausea, vomiting and diarrhea.  History of hypertension. ? ?History reviewed. No pertinent past medical history. ? ?There are no problems to display for this patient. ? ? ?History reviewed. No pertinent surgical history. ? ? ? ? ?Home Medications   ? ?Prior to Admission medications   ?Medication Sig Start Date End Date Taking? Authorizing Provider  ?acetaminophen (TYLENOL) 325 MG tablet Take 2 tablets (650 mg total) by mouth every 6 (six) hours as needed. 09/16/20   Wallis Bamberg, PA-C  ?albuterol (VENTOLIN HFA) 108 (90 Base) MCG/ACT inhaler Inhale 1-2 puffs into the lungs every 6 (six) hours as needed for wheezing or shortness of breath. 03/06/21   Coralyn Mark, NP  ?amLODipine (NORVASC) 5 MG tablet Take 1 tablet (5 mg total) by mouth daily. 06/01/21   Wallis Bamberg, PA-C  ?hydrOXYzine (ATARAX/VISTARIL) 25 MG tablet Take 0.5-1 tablets (12.5-25 mg total) by mouth every 8 (eight) hours as needed. 09/16/20   Wallis Bamberg, PA-C  ?lisinopril-hydrochlorothiazide (ZESTORETIC) 20-12.5 MG tablet Take 1 tablet by mouth daily. ?Patient not taking: No sig reported 03/22/16   Bing Neighbors, FNP  ?tiZANidine (ZANAFLEX) 4 MG capsule Take 1 capsule (4 mg total) by mouth at bedtime as needed for muscle spasms. 05/09/21   Raspet, Noberto Retort, PA-C  ? ? ?Family History ?Family History  ?Problem Relation Age of Onset  ? Hypertension Mother   ? Hypertension Father   ? ? ?Social History ?Social History   ? ?Tobacco Use  ? Smoking status: Never  ? Smokeless tobacco: Never  ?Vaping Use  ? Vaping Use: Never used  ?Substance Use Topics  ? Alcohol use: Yes  ?  Comment: occ  ? Drug use: No  ? ? ? ?Allergies   ?Patient has no known allergies. ? ? ?Review of Systems ?Review of Systems  ?Constitutional: Negative.   ?HENT:  Positive for sore throat. Negative for congestion, dental problem, drooling, ear discharge, ear pain, facial swelling, hearing loss, mouth sores, nosebleeds, postnasal drip, rhinorrhea, sinus pressure, sinus pain, sneezing, tinnitus, trouble swallowing and voice change.   ?Respiratory:  Positive for cough. Negative for apnea, choking, chest tightness, shortness of breath, wheezing and stridor.   ?Cardiovascular: Negative.   ?Gastrointestinal: Negative.   ?Neurological: Negative.   ? ? ?Physical Exam ?Triage Vital Signs ?ED Triage Vitals  ?Enc Vitals Group  ?   BP 07/31/21 1654 (!) 149/78  ?   Pulse Rate 07/31/21 1654 76  ?   Resp 07/31/21 1654 18  ?   Temp 07/31/21 1654 98.7 ?F (37.1 ?C)  ?   Temp Source 07/31/21 1654 Oral  ?   SpO2 07/31/21 1654 97 %  ?   Weight --   ?   Height --   ?   Head Circumference --   ?   Peak Flow --   ?   Pain Score 07/31/21 1655 6  ?   Pain  Loc --   ?   Pain Edu? --   ?   Excl. in GC? --   ? ?No data found. ? ?Updated Vital Signs ?BP (!) 149/78 (BP Location: Left Arm)   Pulse 76   Temp 98.7 ?F (37.1 ?C) (Oral)   Resp 18   SpO2 97%  ? ?Visual Acuity ?Right Eye Distance:   ?Left Eye Distance:   ?Bilateral Distance:   ? ?Right Eye Near:   ?Left Eye Near:    ?Bilateral Near:    ? ?Physical Exam ?Constitutional:   ?   Appearance: Normal appearance. He is well-developed.  ?HENT:  ?   Head: Normocephalic.  ?   Right Ear: Tympanic membrane and ear canal normal.  ?   Left Ear: Tympanic membrane and ear canal normal.  ?   Nose: No congestion or rhinorrhea.  ?   Mouth/Throat:  ?   Mouth: Mucous membranes are moist.  ?   Pharynx: Posterior oropharyngeal erythema present.  ?    Tonsils: No tonsillar exudate. 1+ on the right. 1+ on the left.  ?Eyes:  ?   Extraocular Movements: Extraocular movements intact.  ?Cardiovascular:  ?   Rate and Rhythm: Normal rate and regular rhythm.  ?   Heart sounds: Normal heart sounds.  ?Pulmonary:  ?   Effort: Pulmonary effort is normal.  ?   Breath sounds: Normal breath sounds.  ?Musculoskeletal:  ?   Cervical back: Normal range of motion and neck supple.  ?Skin: ?   General: Skin is warm and dry.  ?Neurological:  ?   Mental Status: He is alert and oriented to person, place, and time. Mental status is at baseline.  ?Psychiatric:     ?   Mood and Affect: Mood normal.     ?   Behavior: Behavior normal.  ? ? ? ?UC Treatments / Results  ?Labs ?(all labs ordered are listed, but only abnormal results are displayed) ?Labs Reviewed - No data to display ? ?EKG ? ? ?Radiology ?No results found. ? ?Procedures ?Procedures (including critical care time) ? ?Medications Ordered in UC ?Medications - No data to display ? ?Initial Impression / Assessment and Plan / UC Course  ?I have reviewed the triage vital signs and the nursing notes. ? ?Pertinent labs & imaging results that were available during my care of the patient were reviewed by me and considered in my medical decision making (see chart for details). ? ?Viral pharyngitis ? ?Rapid strep negative, sent for culture, discussed findings with patient, etiology of symptoms are most likely viral, recommended over-the-counter medications for supportive care, may follow-up with urgent care as needed, work note given ?Final Clinical Impressions(s) / UC Diagnoses  ? ?Final diagnoses:  ?None  ? ?Discharge Instructions   ?None ?  ? ?ED Prescriptions   ?None ?  ? ?PDMP not reviewed this encounter. ?  ?Valinda Hoar, NP ?07/31/21 1812 ? ?

## 2021-08-03 LAB — CULTURE, GROUP A STREP (THRC)

## 2021-08-11 ENCOUNTER — Encounter (HOSPITAL_COMMUNITY): Payer: Self-pay

## 2021-08-11 ENCOUNTER — Other Ambulatory Visit: Payer: Self-pay

## 2021-08-11 ENCOUNTER — Ambulatory Visit (HOSPITAL_COMMUNITY)
Admission: EM | Admit: 2021-08-11 | Discharge: 2021-08-11 | Disposition: A | Discharge status: Home / Self Care | Payer: 59 | Attending: Internal Medicine | Admitting: Internal Medicine

## 2021-08-11 DIAGNOSIS — L730 Acne keloid: Secondary | ICD-10-CM | POA: Diagnosis not present

## 2021-08-11 DIAGNOSIS — Z113 Encounter for screening for infections with a predominantly sexual mode of transmission: Secondary | ICD-10-CM | POA: Diagnosis present

## 2021-08-11 DIAGNOSIS — Z202 Contact with and (suspected) exposure to infections with a predominantly sexual mode of transmission: Secondary | ICD-10-CM

## 2021-08-11 MED ORDER — FLUOCINONIDE EMULSIFIED BASE 0.05 % EX CREA: 1.0000 "application " | TOPICAL_CREAM | Freq: Two times a day (BID) | CUTANEOUS | 0 refills | Status: DC

## 2021-08-11 MED ORDER — CLINDAMYCIN PHOSPHATE 1 % EX SOLN: Freq: Every day | CUTANEOUS | 0 refills | Status: DC

## 2021-08-11 NOTE — ED Triage Notes (Signed)
Pt presents today for STD testing. Denies sxs. ?

## 2021-08-11 NOTE — Discharge Instructions (Signed)
Your creams have been refilled for your skin condition.  Your STD test is pending.  We will call if it is positive.  Please follow-up with dermatology. ?

## 2021-08-11 NOTE — ED Provider Notes (Signed)
?[facility] ? ? ? ?CSN: [id number] ?Arrival date & time: 08/11/21  1220 ? ? ?  ? ?History   ?Chief Complaint ?Chief Complaint  ?Patient presents with  ? S74.5  ? ? ?HPI ?Michael Hurst is a 21 y.o. male.  ? ?Patient presents today for routine STD testing.  Denies any known confirmed exposure or any current symptoms.  Patient reports that he has been having unprotected sex.  Patient does not want blood work for HIV or syphilis.  Patient also requesting refill on creams for skin condition.  Patient reports that he has keloid acne to the posterior neck that has flared up over the past few days.  He has seen dermatology but reports that "they did not do anything for me so I did not go back".  Last dermatology visit was 6 months ago per patient. He has been treated with antibiotics and fluocinonide cream in the past with resolution of symptoms. ? ? ? ?History reviewed. No pertinent past medical history. ? ?There are no problems to display for this patient. ? ? ?History reviewed. No pertinent surgical history. ? ? ? ? ?Home Medications   ? ?Prior to Admission medications   ?Medication Sig Start Date End Date Taking? Authorizing Provider  ?clindamycin (CLEOCIN-T) 1 % external solution Apply topically daily. 08/11/21  Yes Gustavus Bryant, FNP  ?fluocinonide-emollient (LIDEX-E) 0.05 % cream Apply 1 application. topically 2 (two) times daily. 08/11/21  Yes Gustavus Bryant, FNP  ?acetaminophen (TYLENOL) 325 MG tablet Take 2 tablets (650 mg total) by mouth every 6 (six) hours as needed. 09/16/20   Wallis Bamberg, PA-C  ?albuterol (VENTOLIN HFA) 108 (90 Base) MCG/ACT inhaler Inhale 1-2 puffs into the lungs every 6 (six) hours as needed for wheezing or shortness of breath. 03/06/21   Coralyn Mark, NP  ?amLODipine (NORVASC) 5 MG tablet Take 1 tablet (5 mg total) by mouth daily. 06/01/21   Wallis Bamberg, PA-C  ?hydrOXYzine (ATARAX/VISTARIL) 25 MG tablet Take 0.5-1 tablets (12.5-25 mg total) by mouth every 8 (eight) hours as  needed. 09/16/20   Wallis Bamberg, PA-C  ?lisinopril-hydrochlorothiazide (ZESTORETIC) 20-12.5 MG tablet Take 1 tablet by mouth daily. ?Patient not taking: No sig reported 03/22/16   Bing Neighbors, FNP  ?tiZANidine (ZANAFLEX) 4 MG capsule Take 1 capsule (4 mg total) by mouth at bedtime as needed for muscle spasms. 05/09/21   Raspet, Noberto Retort, PA-C  ? ? ?Family History ?Family History  ?Problem Relation Age of Onset  ? Hypertension Mother   ? Hypertension Father   ? ? ?Social History ?Social History  ? ?Tobacco Use  ? Smoking status: Never  ? Smokeless tobacco: Never  ?Vaping Use  ? Vaping Use: Never used  ?Substance Use Topics  ? Alcohol use: Yes  ?  Comment: occ  ? Drug use: No  ? ? ? ?Allergies   ?Patient has no known allergies. ? ? ?Review of Systems ?Review of Systems ?Per HPI ? ?Physical Exam ?Triage Vital Signs ?ED Triage Vitals  ?Enc Vitals Group  ?   BP 08/11/21 1354 (!) 165/73  ?   Pulse Rate 08/11/21 1354 82  ?   Resp 08/11/21 1354 18  ?   Temp 08/11/21 1354 98.3 ?F (36.8 ?C)  ?   Temp Source 08/11/21 1354 Oral  ?   SpO2 08/11/21 1354 99 %  ?   Weight --   ?   Height --   ?   Head Circumference --   ?  Peak Flow --   ?   Pain Score 08/11/21 1353 0  ?   Pain Loc --   ?   Pain Edu? --   ?   Excl. in GC? --   ? ?No data found. ? ?Updated Vital Signs ?BP (!) 165/73 (BP Location: Right Arm)   Pulse 82   Temp 98.3 ?F (36.8 ?C) (Oral)   Resp 18   SpO2 99%  ? ?Visual Acuity ?Right Eye Distance:   ?Left Eye Distance:   ?Bilateral Distance:   ? ?Right Eye Near:   ?Left Eye Near:    ?Bilateral Near:    ? ?Physical Exam ?Constitutional:   ?   General: He is not in acute distress. ?   Appearance: Normal appearance. He is not toxic-appearing or diaphoretic.  ?HENT:  ?   Head: Normocephalic and atraumatic.  ?Eyes:  ?   Extraocular Movements: Extraocular movements intact.  ?   Conjunctiva/sclera: Conjunctivae normal.  ?Pulmonary:  ?   Effort: Pulmonary effort is normal.  ?Genitourinary: ?   Comments: Deferred with  shared decision making. Self swab performed.  ?Skin: ?   Comments: Pustular like acne lesions present to posterior neck in clustered area.  ?Neurological:  ?   General: No focal deficit present.  ?   Mental Status: He is alert and oriented to person, place, and time. Mental status is at baseline.  ?Psychiatric:     ?   Mood and Affect: Mood normal.     ?   Behavior: Behavior normal.     ?   Thought Content: Thought content normal.     ?   Judgment: Judgment normal.  ? ? ? ?UC Treatments / Results  ?Labs ?(all labs ordered are listed, but only abnormal results are displayed) ?Labs Reviewed  ?CYTOLOGY, (ORAL, ANAL, URETHRAL) ANCILLARY ONLY  ? ? ?EKG ? ? ?Radiology ?No results found. ? ?Procedures ?Procedures (including critical care time) ? ?Medications Ordered in UC ?Medications - No data to display ? ?Initial Impression / Assessment and Plan / UC Course  ?I have reviewed the triage vital signs and the nursing notes. ? ?Pertinent labs & imaging results that were available during my care of the patient were reviewed by me and considered in my medical decision making (see chart for details). ? ?  ? ?Routine STD penile swab pending.  Patient declined blood work for HIV or syphilis.  Patient advised to refrain from sexual activity until test results and treatment are complete.  Will await results if any treatment is needed. ? ?After further review of the chart, it does appear the patient was diagnosed with acute keloid acne to posterior neck.  Physical exam appears consistent with this.  Will treat with clindamycin topical solution as well as fluocinonide steroid as patient has tolerated this well and has seen results with successful treatment.  Patient advised to use this sparingly and to follow-up with dermatology given recurrent flareups.  Patient verbalized understanding and was agreeable with plan. ?Final Clinical Impressions(s) / UC Diagnoses  ? ?Final diagnoses:  ?Screening examination for venereal disease  ?Acne  keloidalis nuchae  ? ? ? ?Discharge Instructions   ? ?  ?Your creams have been refilled for your skin condition.  Your STD test is pending.  We will call if it is positive.  Please follow-up with dermatology. ? ? ? ?ED Prescriptions   ? ? Medication Sig Dispense Auth. Provider  ? clindamycin (CLEOCIN-T) 1 % external solution Apply topically daily.  60 mL Gustavus Bryant, [place]  ? fluocinonide-emollient (LIDEX-E) 0.05 % cream Apply 1 application. topically 2 (two) times daily. 30 g Gustavus Bryant, [place]  ? ?  ? ?PDMP not reviewed this encounter. ?  ?Gustavus Bryant, [place] ?08/11/21 1430 ? ?

## 2021-08-13 LAB — CYTOLOGY, (ORAL, ANAL, URETHRAL) ANCILLARY ONLY
Chlamydia: NEGATIVE
Comment: NEGATIVE
Comment: NEGATIVE
Comment: NORMAL
Neisseria Gonorrhea: POSITIVE — AB
Trichomonas: NEGATIVE

## 2021-08-14 ENCOUNTER — Ambulatory Visit (HOSPITAL_COMMUNITY)
Admission: EM | Admit: 2021-08-14 | Discharge: 2021-08-14 | Disposition: A | Discharge status: Home / Self Care | Payer: 59 | Attending: Family Medicine | Admitting: Family Medicine

## 2021-08-14 ENCOUNTER — Other Ambulatory Visit: Payer: Self-pay

## 2021-08-14 DIAGNOSIS — A549 Gonococcal infection, unspecified: Secondary | ICD-10-CM | POA: Diagnosis not present

## 2021-08-14 MED ORDER — CEFTRIAXONE SODIUM 500 MG IJ SOLR
INTRAMUSCULAR | Status: AC
  Filled 2021-08-14: qty 500

## 2021-08-14 MED ORDER — CEFTRIAXONE SODIUM 500 MG IJ SOLR
500.0000 mg | Freq: Once | INTRAMUSCULAR | Status: AC
  Administered 2021-08-14: 500 mg via INTRAMUSCULAR

## 2021-08-14 NOTE — ED Triage Notes (Signed)
Pt needing treatment for positive STD.  

## 2021-09-01 ENCOUNTER — Other Ambulatory Visit: Payer: Self-pay

## 2021-09-01 ENCOUNTER — Ambulatory Visit (HOSPITAL_COMMUNITY)
Admission: EM | Admit: 2021-09-01 | Discharge: 2021-09-01 | Disposition: A | Discharge status: Home / Self Care | Payer: 59 | Attending: Emergency Medicine | Admitting: Emergency Medicine

## 2021-09-01 ENCOUNTER — Encounter (HOSPITAL_COMMUNITY): Payer: Self-pay

## 2021-09-01 DIAGNOSIS — J302 Other seasonal allergic rhinitis: Secondary | ICD-10-CM | POA: Diagnosis present

## 2021-09-01 LAB — POCT RAPID STREP A, ED / UC: Streptococcus, Group A Screen (Direct): NEGATIVE

## 2021-09-01 MED ORDER — PREDNISONE 20 MG PO TABS: 40.0000 mg | ORAL_TABLET | Freq: Every day | ORAL | 0 refills | Status: DC

## 2021-09-01 MED ORDER — FEXOFENADINE-PSEUDOEPHED ER 60-120 MG PO TB12: 1.0000 | ORAL_TABLET | Freq: Two times a day (BID) | ORAL | 0 refills | Status: DC

## 2021-09-01 NOTE — ED Provider Notes (Signed)
?[facility] ? ? ? ?CSN: [id number] ?Arrival date & time: 09/01/21  1013 ? ? ?  ? ?History   ?Chief Complaint ?Chief Complaint  ?Patient presents with  ? Sore Throat  ? ? ?HPI ?Michael Hurst is a 21 y.o. male.  ? ?Patient presents with nasal congestion, rhinorrhea and sore throat for 1 week.  Painful to swallow but tolerating food and liquids.  No known sick contacts.  Has attempted use of TheraFlu which has not been helpful.  History of seasonal allergies, currently taking Zyrtec but endorses the medicine has not been effective.  Denies fever, chills, body aches, shortness of breath, wheezing, ear pain, headaches.   ? ?History reviewed. No pertinent past medical history. ? ?There are no problems to display for this patient. ? ? ?History reviewed. No pertinent surgical history. ? ? ? ? ?Home Medications   ? ?Prior to Admission medications   ?Medication Sig Start Date End Date Taking? Authorizing Provider  ?acetaminophen (TYLENOL) 325 MG tablet Take 2 tablets (650 mg total) by mouth every 6 (six) hours as needed. 09/16/20   Wallis Bamberg, PA-C  ?albuterol (VENTOLIN HFA) 108 (90 Base) MCG/ACT inhaler Inhale 1-2 puffs into the lungs every 6 (six) hours as needed for wheezing or shortness of breath. 03/06/21   Coralyn Mark, NP  ?amLODipine (NORVASC) 5 MG tablet Take 1 tablet (5 mg total) by mouth daily. 06/01/21   Wallis Bamberg, PA-C  ?clindamycin (CLEOCIN-T) 1 % external solution Apply topically daily. 08/11/21   Gustavus Bryant, FNP  ?fluocinonide-emollient (LIDEX-E) 0.05 % cream Apply 1 application. topically 2 (two) times daily. 08/11/21   Gustavus Bryant, FNP  ?hydrOXYzine (ATARAX/VISTARIL) 25 MG tablet Take 0.5-1 tablets (12.5-25 mg total) by mouth every 8 (eight) hours as needed. 09/16/20   Wallis Bamberg, PA-C  ?lisinopril-hydrochlorothiazide (ZESTORETIC) 20-12.5 MG tablet Take 1 tablet by mouth daily. ?Patient not taking: No sig reported 03/22/16   Bing Neighbors, FNP  ?tiZANidine (ZANAFLEX) 4 MG capsule  Take 1 capsule (4 mg total) by mouth at bedtime as needed for muscle spasms. 05/09/21   Raspet, Noberto Retort, PA-C  ? ? ?Family History ?Family History  ?Problem Relation Age of Onset  ? Hypertension Mother   ? Hypertension Father   ? ? ?Social History ?Social History  ? ?Tobacco Use  ? Smoking status: Never  ? Smokeless tobacco: Never  ?Vaping Use  ? Vaping Use: Never used  ?Substance Use Topics  ? Alcohol use: Yes  ?  Comment: occ  ? Drug use: No  ? ? ? ?Allergies   ?Patient has no known allergies. ? ? ?Review of Systems ?Review of Systems  ?Constitutional: Negative.   ?HENT:  Positive for congestion, rhinorrhea and sore throat. Negative for dental problem, drooling, ear discharge, ear pain, facial swelling, hearing loss, mouth sores, nosebleeds, postnasal drip, sinus pressure, sinus pain, sneezing, tinnitus, trouble swallowing and voice change.   ?Respiratory:  Positive for cough. Negative for apnea, choking, chest tightness, shortness of breath, wheezing and stridor.   ?Cardiovascular: Negative.   ?Gastrointestinal: Negative.   ?Skin: Negative.   ?Neurological: Negative.   ? ? ?Physical Exam ?Triage Vital Signs ?ED Triage Vitals  ?Enc Vitals Group  ?   BP 09/01/21 1101 133/79  ?   Pulse Rate 09/01/21 1101 69  ?   Resp 09/01/21 1101 18  ?   Temp 09/01/21 1101 98.1 ?F (36.7 ?C)  ?   Temp src --   ?  SpO2 09/01/21 1101 96 %  ?   Weight --   ?   Height --   ?   Head Circumference --   ?   Peak Flow --   ?   Pain Score 09/01/21 1102 0  ?   Pain Loc --   ?   Pain Edu? --   ?   Excl. in GC? --   ? ?No data found. ? ?Updated Vital Signs ?BP 133/79 (BP Location: Right Arm)   Pulse 69   Temp 98.1 ?F (36.7 ?C)   Resp 18   SpO2 96%  ? ?Visual Acuity ?Right Eye Distance:   ?Left Eye Distance:   ?Bilateral Distance:   ? ?Right Eye Near:   ?Left Eye Near:    ?Bilateral Near:    ? ?Physical Exam ?Constitutional:   ?   Appearance: He is well-developed.  ?HENT:  ?   Head: Normocephalic.  ?   Right Ear: Tympanic membrane and ear  canal normal.  ?   Left Ear: Tympanic membrane and ear canal normal.  ?   Nose: Congestion present. No rhinorrhea.  ?   Mouth/Throat:  ?   Mouth: Mucous membranes are moist.  ?   Pharynx: Posterior oropharyngeal erythema present.  ?   Tonsils: No tonsillar exudate. 0 on the right. 0 on the left.  ?Cardiovascular:  ?   Rate and Rhythm: Normal rate and regular rhythm.  ?   Heart sounds: Normal heart sounds.  ?Pulmonary:  ?   Effort: Pulmonary effort is normal.  ?   Breath sounds: Normal breath sounds.  ?Musculoskeletal:  ?   Cervical back: Normal range of motion and neck supple.  ?Neurological:  ?   General: No focal deficit present.  ?   Mental Status: He is alert and oriented to person, place, and time.  ?Psychiatric:     ?   Mood and Affect: Mood normal.     ?   Behavior: Behavior normal.  ? ? ? ?UC Treatments / Results  ?Labs ?(all labs ordered are listed, but only abnormal results are displayed) ?Labs Reviewed - No data to display ? ?EKG ? ? ?Radiology ?No results found. ? ?Procedures ?Procedures (including critical care time) ? ?Medications Ordered in UC ?Medications - No data to display ? ?Initial Impression / Assessment and Plan / UC Course  ?I have reviewed the triage vital signs and the nursing notes. ? ?Pertinent labs & imaging results that were available during my care of the patient were reviewed by me and considered in my medical decision making (see chart for details). ? ?Seasonal allergies ? ?Rapid strep negative, sent for culture,, etiology is symptoms are most likely related to nasal congestion due to seasonal allergies, low suspicion for infectious cause, prescribed Allegra-D for further management of congestion and Zyrtec has not been effective, given prednisone 40 mg burst to provide comfort to the throat, recommended salt water gargles, throat lozenges warm liquids and teaspoons of honey for additional supportive care, may follow-up with urgent care as needed ?Final Clinical Impressions(s) / UC  Diagnoses  ? ?Final diagnoses:  ?None  ? ?Discharge Instructions   ?None ?  ? ?ED Prescriptions   ?None ?  ? ?PDMP not reviewed this encounter. ?  ?Valinda Hoar, NP ?09/01/21 1216 ? ?

## 2021-09-01 NOTE — Discharge Instructions (Addendum)
I believe your symptoms today are being caused by your seasonal allergies ? ?Your strep test was negative I have a low suspicion of a viral process as there has been no fever ? ?Begin use of prednisone every morning with food for the next 5 days, this medicine helps to reduce swelling which ideally will help with some of your throat discomfort ? ?Begin taking Allegra-D twice daily, this medicine has an antihistamine similar to Claritin and a decongestant to help minimize your secretions ? ?You may attempt salt water gargles, warm liquids, throat lozenges, soft foods and teaspoons of honey for additional support to your throat ? ?You may follow-up with urgent care as needed ?

## 2021-09-01 NOTE — ED Triage Notes (Signed)
Pt states that he was seen here recently for STD and checked for strep. Pt states the continues to have a sore throat that is worse with swallowing. PT states he was positive for STD and states his throat was not checked for same.  ?

## 2021-09-04 LAB — CULTURE, GROUP A STREP (THRC)

## 2021-09-27 ENCOUNTER — Encounter (HOSPITAL_COMMUNITY): Payer: Self-pay | Admitting: Emergency Medicine

## 2021-09-27 ENCOUNTER — Ambulatory Visit (HOSPITAL_COMMUNITY)
Admission: EM | Admit: 2021-09-27 | Discharge: 2021-09-27 | Disposition: A | Discharge status: Home / Self Care | Payer: 59 | Attending: Internal Medicine | Admitting: Internal Medicine

## 2021-09-27 DIAGNOSIS — Z202 Contact with and (suspected) exposure to infections with a predominantly sexual mode of transmission: Secondary | ICD-10-CM | POA: Diagnosis not present

## 2021-09-27 DIAGNOSIS — Z113 Encounter for screening for infections with a predominantly sexual mode of transmission: Secondary | ICD-10-CM | POA: Insufficient documentation

## 2021-09-27 HISTORY — DX: Essential (primary) hypertension: I10

## 2021-09-27 LAB — HIV ANTIBODY (ROUTINE TESTING W REFLEX): HIV Screen 4th Generation wRfx: NONREACTIVE

## 2021-09-27 NOTE — ED Triage Notes (Addendum)
Patient presents for STD Testing today.  ? ?Patient denies any recent exposures, complaints, or symptoms.  ? ?Patient request cytology and blood testing (HIV, Syphilis).  ?

## 2021-09-27 NOTE — Discharge Instructions (Signed)
Your STD tests are pending.  We will call if they are positive and treat as appropriate. ?

## 2021-09-27 NOTE — ED Provider Notes (Signed)
?[facility] ? ? ? ?CSN: [id number] ?Arrival date & time: 09/27/21  1011 ? ? ?  ? ?History   ?Chief Complaint ?Chief Complaint  ?Patient presents with  ? STI Testing   ? ? ?HPI ?Michael Hurst is a 21 y.o. male.  ? ?Patient presents today for routine STD testing.  Denies any recent symptoms or exposures.  Patient requesting penile swab and testing for HIV and syphilis. ? ? ? ?Past Medical History:  ?Diagnosis Date  ? Hypertension   ? ? ?There are no problems to display for this patient. ? ? ?History reviewed. No pertinent surgical history. ? ? ? ? ?Home Medications   ? ?Prior to Admission medications   ?Medication Sig Start Date End Date Taking? Authorizing Provider  ?acetaminophen (TYLENOL) 325 MG tablet Take 2 tablets (650 mg total) by mouth every 6 (six) hours as needed. 09/16/20   Wallis Bamberg, PA-C  ?albuterol (VENTOLIN HFA) 108 (90 Base) MCG/ACT inhaler Inhale 1-2 puffs into the lungs every 6 (six) hours as needed for wheezing or shortness of breath. 03/06/21   Coralyn Mark, NP  ?amLODipine (NORVASC) 5 MG tablet Take 1 tablet (5 mg total) by mouth daily. 06/01/21   Wallis Bamberg, PA-C  ?clindamycin (CLEOCIN-T) 1 % external solution Apply topically daily. 08/11/21   Gustavus Bryant, FNP  ?fexofenadine-pseudoephedrine (ALLEGRA-D) 60-120 MG 12 hr tablet Take 1 tablet by mouth 2 (two) times daily. 09/01/21   Valinda Hoar, NP  ?fluocinonide-emollient (LIDEX-E) 0.05 % cream Apply 1 application. topically 2 (two) times daily. 08/11/21   Gustavus Bryant, FNP  ?hydrOXYzine (ATARAX/VISTARIL) 25 MG tablet Take 0.5-1 tablets (12.5-25 mg total) by mouth every 8 (eight) hours as needed. 09/16/20   Wallis Bamberg, PA-C  ?lisinopril-hydrochlorothiazide (ZESTORETIC) 20-12.5 MG tablet Take 1 tablet by mouth daily. ?Patient not taking: Reported on 11/23/2016 03/22/16   Bing Neighbors, FNP  ?predniSONE (DELTASONE) 20 MG tablet Take 2 tablets (40 mg total) by mouth daily. 09/01/21   Valinda Hoar, NP  ?tiZANidine  (ZANAFLEX) 4 MG capsule Take 1 capsule (4 mg total) by mouth at bedtime as needed for muscle spasms. 05/09/21   Raspet, Noberto Retort, PA-C  ? ? ?Family History ?Family History  ?Problem Relation Age of Onset  ? Hypertension Mother   ? Hypertension Father   ? ? ?Social History ?Social History  ? ?Tobacco Use  ? Smoking status: Never  ? Smokeless tobacco: Never  ?Vaping Use  ? Vaping Use: Never used  ?Substance Use Topics  ? Alcohol use: Yes  ?  Comment: occ  ? Drug use: No  ? ? ? ?Allergies   ?Patient has no known allergies. ? ? ?Review of Systems ?Review of Systems ?Per HPI ? ?Physical Exam ?Triage Vital Signs ?ED Triage Vitals  ?Enc Vitals Group  ?   BP 09/27/21 1116 140/75  ?   Pulse Rate 09/27/21 1116 63  ?   Resp 09/27/21 1116 16  ?   Temp 09/27/21 1116 98.1 ?F (36.7 ?C)  ?   Temp Source 09/27/21 1116 Oral  ?   SpO2 09/27/21 1116 100 %  ?   Weight --   ?   Height --   ?   Head Circumference --   ?   Peak Flow --   ?   Pain Score 09/27/21 1114 0  ?   Pain Loc --   ?   Pain Edu? --   ?   Excl. in GC? --   ? ?  No data found. ? ?Updated Vital Signs ?BP 140/75 (BP Location: Left Arm)   Pulse 63   Temp 98.1 ?F (36.7 ?C) (Oral)   Resp 16   SpO2 100%  ? ?Visual Acuity ?Right Eye Distance:   ?Left Eye Distance:   ?Bilateral Distance:   ? ?Right Eye Near:   ?Left Eye Near:    ?Bilateral Near:    ? ?Physical Exam ?Constitutional:   ?   General: He is not in acute distress. ?   Appearance: Normal appearance. He is not toxic-appearing or diaphoretic.  ?HENT:  ?   Head: Normocephalic and atraumatic.  ?Eyes:  ?   Extraocular Movements: Extraocular movements intact.  ?   Conjunctiva/sclera: Conjunctivae normal.  ?Pulmonary:  ?   Effort: Pulmonary effort is normal.  ?Genitourinary: ?   Comments: Deferred with shared decision-making.  Self swab performed. ?Neurological:  ?   General: No focal deficit present.  ?   Mental Status: He is alert and oriented to person, place, and time. Mental status is at baseline.  ?Psychiatric:     ?    Mood and Affect: Mood normal.     ?   Behavior: Behavior normal.     ?   Thought Content: Thought content normal.     ?   Judgment: Judgment normal.  ? ? ? ?UC Treatments / Results  ?Labs ?(all labs ordered are listed, but only abnormal results are displayed) ?Labs Reviewed  ?RPR  ?HIV ANTIBODY (ROUTINE TESTING W REFLEX)  ?CYTOLOGY, (ORAL, ANAL, URETHRAL) ANCILLARY ONLY  ? ? ?EKG ? ? ?Radiology ?No results found. ? ?Procedures ?Procedures (including critical care time) ? ?Medications Ordered in UC ?Medications - No data to display ? ?Initial Impression / Assessment and Plan / UC Course  ?I have reviewed the triage vital signs and the nursing notes. ? ?Pertinent labs & imaging results that were available during my care of the patient were reviewed by me and considered in my medical decision making (see chart for details). ? ?  ? ?Routine STD test pending.  Patient to refrain from sexual activity until test results and treatment are complete.  Patient verbalized understanding and was agreeable with plan. ?Final Clinical Impressions(s) / UC Diagnoses  ? ?Final diagnoses:  ?Screening examination for venereal disease  ? ? ? ?Discharge Instructions   ? ?  ?Your STD tests are pending.  We will call if they are positive and treat as appropriate. ? ? ? ?ED Prescriptions   ?None ?  ? ?PDMP not reviewed this encounter. ?  ?Gustavus Bryant, [place] ?09/27/21 1149 ? ?

## 2021-09-28 LAB — RPR: RPR Ser Ql: NONREACTIVE

## 2021-09-28 LAB — CYTOLOGY, (ORAL, ANAL, URETHRAL) ANCILLARY ONLY
Chlamydia: NEGATIVE
Comment: NEGATIVE
Comment: NEGATIVE
Comment: NORMAL
Neisseria Gonorrhea: NEGATIVE
Trichomonas: NEGATIVE

## 2021-10-07 ENCOUNTER — Ambulatory Visit (HOSPITAL_COMMUNITY)
Admission: EM | Admit: 2021-10-07 | Discharge: 2021-10-07 | Disposition: A | Discharge status: Home / Self Care | Payer: 59 | Attending: Family Medicine | Admitting: Family Medicine

## 2021-10-07 ENCOUNTER — Encounter (HOSPITAL_COMMUNITY): Payer: Self-pay

## 2021-10-07 DIAGNOSIS — L509 Urticaria, unspecified: Secondary | ICD-10-CM

## 2021-10-07 MED ORDER — METHYLPREDNISOLONE ACETATE 80 MG/ML IJ SUSP
80.0000 mg | Freq: Once | INTRAMUSCULAR | Status: AC
  Administered 2021-10-07: 80 mg via INTRAMUSCULAR

## 2021-10-07 MED ORDER — METHYLPREDNISOLONE ACETATE 80 MG/ML IJ SUSP
INTRAMUSCULAR | Status: AC
  Filled 2021-10-07: qty 1

## 2021-10-07 MED ORDER — PREDNISONE 20 MG PO TABS: 40.0000 mg | ORAL_TABLET | Freq: Every day | ORAL | 0 refills | Status: AC

## 2021-10-07 NOTE — Discharge Instructions (Addendum)
You have been given a shot of Depo-Medrol 80 mg  Take prednisone 20 mg--2 daily for 5 days.  You may also continue Benadryl as you needed

## 2021-10-07 NOTE — ED Provider Notes (Signed)
[facility]    CSN: [id number] Arrival date & time: 10/07/21  1014      History   Chief Complaint Chief Complaint  Patient presents with   Rash    HPI Michael Hurst is a 21 y.o. male.    Rash Here for rash and urticaria that come and gone in the last 24 hours or so.  He first noticed it yesterday and took some Benadryl and it improved.  It came back this morning and that has improved with the Benadryl he took earlier.  He denies any shortness of breath or wheezing.  Also no lip swelling or tongue swelling or trouble swallowing.  No fever   Past Medical History:  Diagnosis Date   Hypertension     There are no problems to display for this patient.   History reviewed. No pertinent surgical history.     Home Medications    Prior to Admission medications   Medication Sig Start Date End Date Taking? Authorizing Provider  predniSONE (DELTASONE) 20 MG tablet Take 2 tablets (40 mg total) by mouth daily with breakfast for 5 days. 10/07/21 10/12/21 Yes Galilee Pierron, Janace Aris, MD  acetaminophen (TYLENOL) 325 MG tablet Take 2 tablets (650 mg total) by mouth every 6 (six) hours as needed. 09/16/20   Wallis Bamberg, PA-C  albuterol (VENTOLIN HFA) 108 (90 Base) MCG/ACT inhaler Inhale 1-2 puffs into the lungs every 6 (six) hours as needed for wheezing or shortness of breath. 03/06/21   Coralyn Mark, NP  amLODipine (NORVASC) 5 MG tablet Take 1 tablet (5 mg total) by mouth daily. 06/01/21   Wallis Bamberg, PA-C  clindamycin (CLEOCIN-T) 1 % external solution Apply topically daily. 08/11/21   Mound, Acie Fredrickson, FNP  fexofenadine-pseudoephedrine (ALLEGRA-D) 60-120 MG 12 hr tablet Take 1 tablet by mouth 2 (two) times daily. 09/01/21   White, Elita Boone, NP  fluocinonide-emollient (LIDEX-E) 0.05 % cream Apply 1 application. topically 2 (two) times daily. 08/11/21   Gustavus Bryant, FNP  hydrOXYzine (ATARAX/VISTARIL) 25 MG tablet Take 0.5-1 tablets (12.5-25 mg total) by mouth every 8 (eight)  hours as needed. 09/16/20   Wallis Bamberg, PA-C  lisinopril-hydrochlorothiazide (ZESTORETIC) 20-12.5 MG tablet Take 1 tablet by mouth daily. Patient not taking: Reported on 11/23/2016 03/22/16   Bing Neighbors, FNP    Family History Family History  Problem Relation Age of Onset   Hypertension Mother    Hypertension Father     Social History Social History   Tobacco Use   Smoking status: Never   Smokeless tobacco: Never  Vaping Use   Vaping Use: Never used  Substance Use Topics   Alcohol use: Yes    Comment: occ   Drug use: No     Allergies   Patient has no known allergies.   Review of Systems Review of Systems  Skin:  Positive for rash.    Physical Exam Triage Vital Signs ED Triage Vitals [10/07/21 1113]  Enc Vitals Group     BP (!) 151/80     Pulse Rate 61     Resp 17     Temp 97.8 F (36.6 C)     Temp Source Oral     SpO2 100 %     Weight      Height      Head Circumference      Peak Flow      Pain Score 0     Pain Loc      Pain Edu?  Excl. in GC?    No data found.  Updated Vital Signs BP (!) 151/80 (BP Location: Left Arm)   Pulse 61   Temp 97.8 F (36.6 C) (Oral)   Resp 17   SpO2 100%   Visual Acuity Right Eye Distance:   Left Eye Distance:   Bilateral Distance:    Right Eye Near:   Left Eye Near:    Bilateral Near:     Physical Exam Vitals reviewed.  Constitutional:      General: He is not in acute distress.    Appearance: He is not ill-appearing, toxic-appearing or diaphoretic.  HENT:     Nose: Nose normal.     Mouth/Throat:     Mouth: Mucous membranes are moist.     Pharynx: No oropharyngeal exudate or posterior oropharyngeal erythema.  Eyes:     Extraocular Movements: Extraocular movements intact.     Conjunctiva/sclera: Conjunctivae normal.     Pupils: Pupils are equal, round, and reactive to light.  Cardiovascular:     Rate and Rhythm: Normal rate and regular rhythm.     Heart sounds: No murmur heard. Pulmonary:      Effort: Pulmonary effort is normal. No respiratory distress.     Breath sounds: Normal breath sounds. No stridor. No wheezing, rhonchi or rales.  Musculoskeletal:     Cervical back: Neck supple.  Lymphadenopathy:     Cervical: No cervical adenopathy.  Skin:    Coloration: Skin is not jaundiced or pale.  Neurological:     Mental Status: He is alert and oriented to person, place, and time.  Psychiatric:        Behavior: Behavior normal.     UC Treatments / Results  Labs (all labs ordered are listed, but only abnormal results are displayed) Labs Reviewed - No data to display  EKG   Radiology No results found.  Procedures Procedures (including critical care time)  Medications Ordered in UC Medications  methylPREDNISolone acetate (DEPO-MEDROL) injection 80 mg (has no administration in time range)    Initial Impression / Assessment and Plan / UC Course  I have reviewed the triage vital signs and the nursing notes.  Pertinent labs & imaging results that were available during my care of the patient were reviewed by me and considered in my medical decision making (see chart for details).     We will treat for allergic reaction.  He will be seeing a PCP as a new patient 1 June, and will discuss further with [facility], Final Clinical Impressions(s) / UC Diagnoses   Final diagnoses:  Urticaria     Discharge Instructions      You have been given a shot of Depo-Medrol 80 mg  Take prednisone 20 mg--2 daily for 5 days.  You may also continue Benadryl as you needed     ED Prescriptions     Medication Sig Dispense Auth. Provider   predniSONE (DELTASONE) 20 MG tablet Take 2 tablets (40 mg total) by mouth daily with breakfast for 5 days. 10 tablet Marlinda Mike Janace Aris, MD      PDMP not reviewed this encounter.   Zenia Resides, MD 10/07/21 1154

## 2021-10-07 NOTE — ED Triage Notes (Signed)
Yesterday, after playing basketball outside Pt reports a sudden onset of pruritic welts on BUE, BLE and back. Has been taking benadryl w/temporary relief.

## 2021-10-18 ENCOUNTER — Ambulatory Visit: Payer: 59 | Admitting: Family Medicine

## 2021-11-21 ENCOUNTER — Ambulatory Visit (HOSPITAL_COMMUNITY)
Admission: EM | Admit: 2021-11-21 | Discharge: 2021-11-21 | Disposition: A | Discharge status: Home / Self Care | Payer: 59 | Attending: Physician Assistant | Admitting: Physician Assistant

## 2021-11-21 ENCOUNTER — Encounter (HOSPITAL_COMMUNITY): Payer: Self-pay | Admitting: Emergency Medicine

## 2021-11-21 DIAGNOSIS — Z113 Encounter for screening for infections with a predominantly sexual mode of transmission: Secondary | ICD-10-CM | POA: Insufficient documentation

## 2021-11-21 DIAGNOSIS — L739 Follicular disorder, unspecified: Secondary | ICD-10-CM | POA: Insufficient documentation

## 2021-11-21 DIAGNOSIS — I10 Essential (primary) hypertension: Secondary | ICD-10-CM | POA: Diagnosis not present

## 2021-11-21 MED ORDER — MUPIROCIN 2 % EX OINT: 1.0000 | TOPICAL_OINTMENT | Freq: Every day | CUTANEOUS | 0 refills | Status: DC

## 2021-11-21 NOTE — Discharge Instructions (Signed)
Your blood pressure is elevated.  Please monitor your diet for salt and continue with regular activity.  Avoid caffeine, decongestants, NSAIDs (aspirin, ibuprofen/Advil, naproxen/Aleve).  If you develop any chest pain, shortness of breath, headache, vision change you need to go to the emergency room.  Monitor blood pressure at home and if persistently above 140/90 you need to be reevaluated to consider starting medication as we discussed.  We will contact you with your screening results if anything is positive.  Please abstain from sex until you receive results.  Use a condom with each sexual encounter.  If you develop any symptoms please return for reevaluation.  Make sure that you are using a new razor with each grooming.  Use antibacterial soap.  Use warm compresses on specific lesions.  Apply Bactroban ointment to specific lesions.  If you have any increase in frequency of lesions or they become enlarged please return for reevaluation as we may need to consider oral antibiotics.

## 2021-11-21 NOTE — ED Provider Notes (Signed)
[facility]    CSN: [id number] Arrival date & time: 11/21/21  1191      History   Chief Complaint Chief Complaint  Patient presents with   STD screening     HPI Michael Hurst is a 21 y.o. male.   Patient presents today for STI screening.  He denies any significant symptoms including discharge, dysuria, pelvic pain, abdominal pain.  He does have a history of STI but has completed treatment in the past.  He declined HIV and syphilis testing as this was completed and negative in May 2023.  He is sexually active with male partners but does not consistently use condoms.  He is not interested in STI bag/condoms in clinic today.  He does report several pustules over her mons pubis that occurred after doing activities.  He has not tried any over-the-counter medication.  Denies any recent antibiotic use.  Reports that these lesions have been improving but he still wanted them to be evaluated.  Blood pressure is elevated.  He does have a history of hypertension but does not currently taking any medication.  He reports achiness when he was younger but has not been taking it recently.  Reports a strong family history of hypertension.  He is exercising regularly.  He does not monitor his diet for salt.  Denies any chest pain, shortness of breath, headache, vision change, dizziness.    Past Medical History:  Diagnosis Date   Hypertension     There are no problems to display for this patient.   History reviewed. No pertinent surgical history.     Home Medications    Prior to Admission medications   Medication Sig Start Date End Date Taking? Authorizing Provider  mupirocin ointment (BACTROBAN) 2 % Apply 1 Application topically daily. 11/21/21  Yes Juda Lajeunesse, Noberto Retort, PA-C    Family History Family History  Problem Relation Age of Onset   Hypertension Mother    Hypertension Father     Social History Social History   Tobacco Use   Smoking status: Never   Smokeless  tobacco: Never  Vaping Use   Vaping Use: Never used  Substance Use Topics   Alcohol use: Yes    Comment: occ   Drug use: No     Allergies   Patient has no known allergies.   Review of Systems Review of Systems  Constitutional:  Negative for activity change, appetite change, fatigue and fever.  Eyes:  Negative for visual disturbance.  Respiratory:  Negative for cough and shortness of breath.   Cardiovascular:  Negative for chest pain.  Gastrointestinal:  Negative for abdominal pain, diarrhea, nausea and vomiting.  Genitourinary:  Negative for dysuria, frequency and urgency.  Neurological:  Negative for dizziness, light-headedness and headaches.     Physical Exam Triage Vital Signs ED Triage Vitals  Enc Vitals Group     BP 11/21/21 0921 (!) 163/75     Pulse Rate 11/21/21 0921 76     Resp 11/21/21 0921 16     Temp 11/21/21 0921 97.9 F (36.6 C)     Temp Source 11/21/21 0921 Oral     SpO2 11/21/21 0921 95 %     Weight --      Height --      Head Circumference --      Peak Flow --      Pain Score 11/21/21 0924 0     Pain Loc --      Pain Edu? --  Excl. in GC? --    No data found.  Updated Vital Signs BP (!) 163/91 Comment: per Katrell Milhorn, pa  Pulse 76   Temp 97.9 F (36.6 C) (Oral)   Resp 16   SpO2 95%   Visual Acuity Right Eye Distance:   Left Eye Distance:   Bilateral Distance:    Right Eye Near:   Left Eye Near:    Bilateral Near:     Physical Exam Vitals reviewed. Exam conducted with a chaperone present.  Constitutional:      General: He is awake.     Appearance: Normal appearance. He is well-developed. He is not ill-appearing.     Comments: Very pleasant male appears stated age in no acute distress sitting comfortably in exam room  HENT:     Head: Normocephalic and atraumatic.     Mouth/Throat:     Pharynx: No oropharyngeal exudate, posterior oropharyngeal erythema or uvula swelling.  Cardiovascular:     Rate and Rhythm: Normal rate and  regular rhythm.     Heart sounds: Normal heart sounds, S1 normal and S2 normal. No murmur heard. Pulmonary:     Effort: Pulmonary effort is normal.     Breath sounds: Normal breath sounds. No stridor. No wheezing, rhonchi or rales.     Comments: Clear to auscultation bilaterally Abdominal:     General: Bowel sounds are normal.     Palpations: Abdomen is soft.     Tenderness: There is no abdominal tenderness.     Comments: Benign abdominal exam  Genitourinary:    Penis: Normal.      Comments: Multiple pustules noted over pubic region.  Diane, RN present as chaperone during exam. Neurological:     Mental Status: He is alert.  Psychiatric:        Behavior: Behavior is cooperative.      UC Treatments / Results  Labs (all labs ordered are listed, but only abnormal results are displayed) Labs Reviewed  CYTOLOGY, (ORAL, ANAL, URETHRAL) ANCILLARY ONLY    EKG   Radiology No results found.  Procedures Procedures (including critical care time)  Medications Ordered in UC Medications - No data to display  Initial Impression / Assessment and Plan / UC Course  I have reviewed the triage vital signs and the nursing notes.  Pertinent labs & imaging results that were available during my care of the patient were reviewed by me and considered in my medical decision making (see chart for details).     Lesions are consistent with folliculitis.  Recommended he change his razor frequently and use antibacterial soap.  He is to apply Bactroban to specific lesions as needed and use warm compresses.  Discussed that if symptoms continue to spread or not improving he should return as we may need to consider systemic antibiotics.  Strict return precautions given.  STI screening obtained today-results pending.  He is to abstain from sex until results are available.  Discussed the importance of safe sex practices.  Offered STI education bag with condoms which he declined.  Discussed that if he  develops any symptoms he is to return for reevaluation.  Discussed that he should start medication given persistently elevated readings.  Patient declined starting medication at this time.  Recommended that he limit salt and continue with regular activity.  Since he is not interested in starting medication at this time he is to monitor his blood pressure at home and if persistently above 140/90 he should return to consider starting medication.  We discussed at length the long-term effects of uncontrolled blood pressure including increased risk of heart attack/stroke as well as nephropathy and retinopathy to which he expressed understanding.  He is to avoid NSAIDs, caffeine, sodium.  Discussed that anything worsens he develops chest pain, shortness of breath, headache, vision change, dizziness in setting of high blood pressure he is to go to the emergency room which she expressed understanding.  Final Clinical Impressions(s) / UC Diagnoses   Final diagnoses:  Folliculitis  Routine screening for STI (sexually transmitted infection)  Elevated blood pressure reading with diagnosis of hypertension     Discharge Instructions      Your blood pressure is elevated.  Please monitor your diet for salt and continue with regular activity.  Avoid caffeine, decongestants, NSAIDs (aspirin, ibuprofen/Advil, naproxen/Aleve).  If you develop any chest pain, shortness of breath, headache, vision change you need to go to the emergency room.  Monitor blood pressure at home and if persistently above 140/90 you need to be reevaluated to consider starting medication as we discussed.  We will contact you with your screening results if anything is positive.  Please abstain from sex until you receive results.  Use a condom with each sexual encounter.  If you develop any symptoms please return for reevaluation.  Make sure that you are using a new razor with each grooming.  Use antibacterial soap.  Use warm compresses on  specific lesions.  Apply Bactroban ointment to specific lesions.  If you have any increase in frequency of lesions or they become enlarged please return for reevaluation as we may need to consider oral antibiotics.     ED Prescriptions     Medication Sig Dispense Auth. Provider   mupirocin ointment (BACTROBAN) 2 % Apply 1 Application topically daily. 22 g Bettylee Feig K, PA-C      PDMP not reviewed this encounter.   Jeani Hawking, PA-C 11/21/21 1016

## 2021-11-21 NOTE — ED Triage Notes (Signed)
Patient presents for STD screening.   Patient denies any STD exposures. Patient denies any symptoms of STD's.

## 2021-11-22 LAB — CYTOLOGY, (ORAL, ANAL, URETHRAL) ANCILLARY ONLY
Chlamydia: NEGATIVE
Comment: NEGATIVE
Comment: NEGATIVE
Comment: NORMAL
Neisseria Gonorrhea: NEGATIVE
Trichomonas: NEGATIVE

## 2022-01-16 ENCOUNTER — Encounter (HOSPITAL_COMMUNITY): Payer: Self-pay | Admitting: Emergency Medicine

## 2022-01-16 ENCOUNTER — Ambulatory Visit (HOSPITAL_COMMUNITY)
Admission: EM | Admit: 2022-01-16 | Discharge: 2022-01-16 | Disposition: A | Discharge status: Home / Self Care | Payer: 59 | Attending: Emergency Medicine | Admitting: Emergency Medicine

## 2022-01-16 DIAGNOSIS — Z113 Encounter for screening for infections with a predominantly sexual mode of transmission: Secondary | ICD-10-CM | POA: Diagnosis present

## 2022-01-16 DIAGNOSIS — M25561 Pain in right knee: Secondary | ICD-10-CM | POA: Insufficient documentation

## 2022-01-16 NOTE — ED Provider Notes (Signed)
[facility]    CSN: [id number] Arrival date & time: 01/16/22  6213      History   Chief Complaint Chief Complaint  Patient presents with   SEXUALLY TRANSMITTED DISEASE    HPI Michael Hurst is a 21 y.o. male.  Presents for STD testing. Denies any known exposures or symptoms. Declines blood work  Additionally reports right knee pain and swelling with exercise.  Reports he does squatting, heavy lifting, basketball.  Notices knee has some pain with full extension after exercising.  He has tried occasional ibuprofen. No recent injury or trauma. No history of injury. Denies fever, redness, effusion  Past Medical History:  Diagnosis Date   Hypertension     There are no problems to display for this patient.   History reviewed. No pertinent surgical history.     Home Medications    Prior to Admission medications   Medication Sig Start Date End Date Taking? Authorizing Provider  mupirocin ointment (BACTROBAN) 2 % Apply 1 Application topically daily. 11/21/21   Raspet, Noberto Retort, PA-C    Family History Family History  Problem Relation Age of Onset   Hypertension Mother    Hypertension Father     Social History Social History   Tobacco Use   Smoking status: Never   Smokeless tobacco: Never  Vaping Use   Vaping Use: Never used  Substance Use Topics   Alcohol use: Yes    Comment: occ   Drug use: No     Allergies   Patient has no known allergies.   Review of Systems Review of Systems  Per HPI  Physical Exam Triage Vital Signs ED Triage Vitals  Enc Vitals Group     BP 01/16/22 0928 137/75     Pulse Rate 01/16/22 0928 67     Resp 01/16/22 0928 15     Temp 01/16/22 0928 99 F (37.2 C)     Temp Source 01/16/22 0928 Oral     SpO2 01/16/22 0928 98 %     Weight --      Height --      Head Circumference --      Peak Flow --      Pain Score 01/16/22 0926 0     Pain Loc --      Pain Edu? --      Excl. in GC? --    No data  found.  Updated Vital Signs BP 137/75 (BP Location: Left Arm)   Pulse 67   Temp 99 F (37.2 C) (Oral)   Resp 15   SpO2 98%    Physical Exam Vitals and nursing note reviewed.  Constitutional:      General: He is not in acute distress.    Appearance: Normal appearance.  HENT:     Mouth/Throat:     Pharynx: Oropharynx is clear.  Cardiovascular:     Rate and Rhythm: Normal rate and regular rhythm.     Pulses: Normal pulses.  Pulmonary:     Effort: Pulmonary effort is normal.  Musculoskeletal:        General: No swelling, tenderness, deformity or signs of injury. Normal range of motion.     Right knee: Normal. No effusion, erythema or bony tenderness. Normal range of motion. No tenderness.     Left knee: Normal.  Skin:    General: Skin is warm and dry.  Neurological:     Mental Status: He is alert and oriented to person, place, and time.  UC Treatments / Results  Labs (all labs ordered are listed, but only abnormal results are displayed) Labs Reviewed  CYTOLOGY, (ORAL, ANAL, URETHRAL) ANCILLARY ONLY    EKG  Radiology No results found.  Procedures Procedures (including critical care time)  Medications Ordered in UC Medications - No data to display  Initial Impression / Assessment and Plan / UC Course  I have reviewed the triage vital signs and the nursing notes.  Pertinent labs & imaging results that were available during my care of the patient were reviewed by me and considered in my medical decision making (see chart for details).  Knee pain No bony tenderness, no injury, xray not warranted at this time. Could be overuse/soft tissue swelling Recommend RICE therapy, follow up with orthopedics if symptoms persist.  STD testing Cyto swab pending, treat as needed. Provided patient education. Return precautions discussed. Patient agrees to plan  Final Clinical Impressions(s) / UC Diagnoses   Final diagnoses:  Screen for STD (sexually transmitted  disease)  Acute pain of right knee  Arthralgia of right knee     Discharge Instructions      We will call you if anything returns positive. Please abstain from sexual intercourse until your results return.  Try ibuprofen, ice, and elevating the leg. Follow up with orthopedics if symptoms persist.     ED Prescriptions   None    PDMP not reviewed this encounter.   Nannette Zill, Lurena Joiner, PA-C 01/16/22 1026

## 2022-01-16 NOTE — Discharge Instructions (Addendum)
We will call you if anything returns positive. Please abstain from sexual intercourse until your results return.  Try ibuprofen, ice, and elevating the leg. Follow up with orthopedics if symptoms persist.

## 2022-01-16 NOTE — ED Triage Notes (Signed)
Pt reports wants STD testing. Denies symptoms or exposures.

## 2022-01-17 LAB — CYTOLOGY, (ORAL, ANAL, URETHRAL) ANCILLARY ONLY
Chlamydia: NEGATIVE
Comment: NEGATIVE
Comment: NORMAL
Neisseria Gonorrhea: NEGATIVE

## 2022-03-19 ENCOUNTER — Encounter (HOSPITAL_COMMUNITY): Payer: Self-pay

## 2022-03-19 ENCOUNTER — Ambulatory Visit (HOSPITAL_COMMUNITY)
Admission: EM | Admit: 2022-03-19 | Discharge: 2022-03-19 | Disposition: A | Discharge status: Home / Self Care | Payer: 59 | Attending: Emergency Medicine | Admitting: Emergency Medicine

## 2022-03-19 DIAGNOSIS — Z113 Encounter for screening for infections with a predominantly sexual mode of transmission: Secondary | ICD-10-CM | POA: Diagnosis not present

## 2022-03-19 DIAGNOSIS — Z203 Contact with and (suspected) exposure to rabies: Secondary | ICD-10-CM

## 2022-03-19 NOTE — ED Triage Notes (Signed)
Pt requesting routine STD testing, denies sx's.

## 2022-03-19 NOTE — Discharge Instructions (Addendum)
We will call you if anything on your swab returns positive. Please abstain from sexual intercourse until your results return. 

## 2022-03-19 NOTE — ED Provider Notes (Signed)
[facility]    CSN: [id number] Arrival date & time: 03/19/22  0808      History   Chief Complaint Chief Complaint  Patient presents with   SEXUALLY TRANSMITTED DISEASE    HPI Michael Hurst is a 21 y.o. male.  Presents for STD testing No symptoms and no known exposures Had blood work done in may this year   Past Medical History:  Diagnosis Date   Hypertension     There are no problems to display for this patient.   History reviewed. No pertinent surgical history.    Home Medications    Prior to Admission medications   Medication Sig Start Date End Date Taking? Authorizing Provider  mupirocin ointment (BACTROBAN) 2 % Apply 1 Application topically daily. 11/21/21   Raspet, Noberto Retort, PA-C    Family History Family History  Problem Relation Age of Onset   Hypertension Mother    Hypertension Father     Social History Social History   Tobacco Use   Smoking status: Never   Smokeless tobacco: Never  Vaping Use   Vaping Use: Never used  Substance Use Topics   Alcohol use: Yes    Comment: occ   Drug use: No     Allergies   Patient has no known allergies.   Review of Systems Review of Systems Per HPI  Physical Exam Triage Vital Signs ED Triage Vitals  Enc Vitals Group     BP 03/19/22 0840 (!) 159/77     Pulse Rate 03/19/22 0840 62     Resp 03/19/22 0840 18     Temp 03/19/22 0840 98.5 F (36.9 C)     Temp Source 03/19/22 0840 Oral     SpO2 03/19/22 0840 98 %     Weight --      Height --      Head Circumference --      Peak Flow --      Pain Score 03/19/22 0839 0     Pain Loc --      Pain Edu? --      Excl. in GC? --    No data found.  Updated Vital Signs BP (!) 159/77 (BP Location: Left Arm)   Pulse 62   Temp 98.5 F (36.9 C) (Oral)   Resp 18   SpO2 98%    Physical Exam Vitals and nursing note reviewed.  Constitutional:      General: He is not in acute distress.    Appearance: Normal appearance.  HENT:      Mouth/Throat:     Pharynx: Oropharynx is clear.  Cardiovascular:     Rate and Rhythm: Normal rate and regular rhythm.     Pulses: Normal pulses.  Pulmonary:     Effort: Pulmonary effort is normal.  Neurological:     Mental Status: He is alert and oriented to person, place, and time.      UC Treatments / Results  Labs (all labs ordered are listed, but only abnormal results are displayed) Labs Reviewed  CYTOLOGY, (ORAL, ANAL, URETHRAL) ANCILLARY ONLY    EKG  Radiology No results found.  Procedures Procedures   Medications Ordered in UC Medications - No data to display  Initial Impression / Assessment and Plan / UC Course  I have reviewed the triage vital signs and the nursing notes.  Pertinent labs & imaging results that were available during my care of the patient were reviewed by me and considered in my medical decision  making (see chart for details).  Cytology swab pending Will treat as needed No other concerns today Return precautions discussed. Patient agrees to plan  Final Clinical Impressions(s) / UC Diagnoses   Final diagnoses:  Screen for STD (sexually transmitted disease)     Discharge Instructions      We will call you if anything on your swab returns positive. Please abstain from sexual intercourse until your results return.      ED Prescriptions   None    PDMP not reviewed this encounter.   Les Pou, [place] 03/19/22 8144

## 2022-03-20 LAB — CYTOLOGY, (ORAL, ANAL, URETHRAL) ANCILLARY ONLY
Chlamydia: NEGATIVE
Comment: NEGATIVE
Comment: NEGATIVE
Comment: NORMAL
Neisseria Gonorrhea: NEGATIVE
Trichomonas: NEGATIVE

## 2022-04-07 ENCOUNTER — Encounter (HOSPITAL_COMMUNITY): Payer: Self-pay | Admitting: *Deleted

## 2022-04-07 ENCOUNTER — Ambulatory Visit (HOSPITAL_COMMUNITY)
Admission: EM | Admit: 2022-04-07 | Discharge: 2022-04-07 | Disposition: A | Discharge status: Home / Self Care | Payer: 59 | Attending: Physician Assistant | Admitting: Physician Assistant

## 2022-04-07 ENCOUNTER — Other Ambulatory Visit: Payer: Self-pay

## 2022-04-07 DIAGNOSIS — Z113 Encounter for screening for infections with a predominantly sexual mode of transmission: Secondary | ICD-10-CM | POA: Insufficient documentation

## 2022-04-07 DIAGNOSIS — Z202 Contact with and (suspected) exposure to infections with a predominantly sexual mode of transmission: Secondary | ICD-10-CM | POA: Diagnosis not present

## 2022-04-07 LAB — HEPATITIS C ANTIBODY: HCV Ab: NONREACTIVE

## 2022-04-07 LAB — HIV ANTIBODY (ROUTINE TESTING W REFLEX): HIV Screen 4th Generation wRfx: NONREACTIVE

## 2022-04-07 NOTE — ED Triage Notes (Signed)
Pt presents for STD check up . Pt denies any SX's.

## 2022-04-07 NOTE — ED Provider Notes (Signed)
[facility]    CSN: [id number] Arrival date & time: 04/07/22  1208      History   Chief Complaint Chief Complaint  Patient presents with   SEXUALLY TRANSMITTED DISEASE    HPI Michael Hurst is a 21 y.o. male.   Patient presents today for STI checkup.  Reports unprotected sexual encounter approximately 2 to 3 weeks ago and would like to have complete STI panel.  He is sexually active with male partners but does not consistently use condoms.  His last testing was 03/19/2022 and was negative for gonorrhea, chlamydia, trichomonas.  Reports sexual encounter was soon after this testing.  He denies any current symptoms including flulike illness, genital lesion, penile discharge, dysuria, abdominal pain, fever, nausea, vomiting.  Denies any recent antibiotics.    Past Medical History:  Diagnosis Date   Hypertension     There are no problems to display for this patient.   History reviewed. No pertinent surgical history.     Home Medications    Prior to Admission medications   Medication Sig Start Date End Date Taking? Authorizing Provider  mupirocin ointment (BACTROBAN) 2 % Apply 1 Application topically daily. 11/21/21   Kynnedi Zweig, Derry Skill, PA-C    Family History Family History  Problem Relation Age of Onset   Hypertension Mother    Hypertension Father     Social History Social History   Tobacco Use   Smoking status: Never   Smokeless tobacco: Never  Vaping Use   Vaping Use: Never used  Substance Use Topics   Alcohol use: Yes    Comment: occ   Drug use: No     Allergies   Patient has no known allergies.   Review of Systems Review of Systems  Constitutional:  Negative for activity change, appetite change, fatigue and fever.  Gastrointestinal:  Negative for abdominal pain, diarrhea, nausea and vomiting.  Genitourinary:  Negative for dysuria, genital sores, penile discharge, penile pain, penile swelling, testicular pain and urgency.      Physical Exam Triage Vital Signs ED Triage Vitals  Enc Vitals Group     BP 04/07/22 1317 (!) 139/91     Pulse Rate 04/07/22 1317 66     Resp 04/07/22 1317 18     Temp 04/07/22 1317 98 F (36.7 C)     Temp src --      SpO2 04/07/22 1317 98 %     Weight --      Height --      Head Circumference --      Peak Flow --      Pain Score 04/07/22 1316 0     Pain Loc --      Pain Edu? --      Excl. in [place]? --    No data found.  Updated Vital Signs BP (!) 139/91   Pulse 66   Temp 98 F (36.7 C)   Resp 18   SpO2 98%   Visual Acuity Right Eye Distance:   Left Eye Distance:   Bilateral Distance:    Right Eye Near:   Left Eye Near:    Bilateral Near:     Physical Exam Vitals reviewed.  Constitutional:      General: He is awake.     Appearance: Normal appearance. He is well-developed. He is not ill-appearing.     Comments: Very pleasant male appears stated age in no acute distress sitting comfortably in exam room  HENT:  Head: Normocephalic and atraumatic.     Mouth/Throat:     Pharynx: No oropharyngeal exudate, posterior oropharyngeal erythema or uvula swelling.  Cardiovascular:     Rate and Rhythm: Normal rate and regular rhythm.     Heart sounds: Normal heart sounds, S1 normal and S2 normal. No murmur heard. Pulmonary:     Effort: Pulmonary effort is normal.     Breath sounds: Normal breath sounds. No stridor. No wheezing, rhonchi or rales.     Comments: Clear to auscultation bilaterally Abdominal:     General: Bowel sounds are normal.     Palpations: Abdomen is soft.     Tenderness: There is no abdominal tenderness. There is no right CVA tenderness, left CVA tenderness, guarding or rebound.     Comments: Benign abdominal exam  Neurological:     Mental Status: He is alert.  Psychiatric:        Behavior: Behavior is cooperative.      UC Treatments / Results  Labs (all labs ordered are listed, but only abnormal results are displayed) Labs Reviewed   HEPATITIS C ANTIBODY  RPR  HIV ANTIBODY (ROUTINE TESTING W REFLEX)  CYTOLOGY, (ORAL, ANAL, URETHRAL) ANCILLARY ONLY    EKG   Radiology No results found.  Procedures Procedures (including critical care time)  Medications Ordered in UC Medications - No data to display  Initial Impression / Assessment and Plan / UC Course  I have reviewed the triage vital signs and the nursing notes.  Pertinent labs & imaging results that were available during my care of the patient were reviewed by me and considered in my medical decision making (see chart for details).     STI swab collected today-results pending.  Patient was instructed to avoid sexual contact until results are available.  HIV/hepatitis/syphilis testing was obtained.  Patient specifically requested hepatitis C testing and all other STIs.  Discussed that we do not typically test for herpes through blood work as this does not provide actionable information but if he develops any lesions he is to be seen.  Discussed importance of safe sex practices.  We will contact him if we need to arrange any treatment.  He is to monitor his MyChart for results.  Discussed that if he develops any symptoms he should be seen immediately.   Final Clinical Impressions(s) / UC Diagnoses   Final diagnoses:  Routine screening for STI (sexually transmitted infection)     Discharge Instructions      Monitor your MyChart for your results.  We will contact you if anything is positive need to arrange treatment.  Please abstain from sex until you receive results.  If you are positive for any STIs all partners will need to be tested and treated as well.  You should use a condom with each sexual encounter.  If you develop any symptoms you need to be seen immediately.     ED Prescriptions   None    PDMP not reviewed this encounter.   Jeani Hawking, PA-C 04/07/22 1332

## 2022-04-07 NOTE — Discharge Instructions (Signed)
Monitor your MyChart for your results.  We will contact you if anything is positive need to arrange treatment.  Please abstain from sex until you receive results.  If you are positive for any STIs all partners will need to be tested and treated as well.  You should use a condom with each sexual encounter.  If you develop any symptoms you need to be seen immediately.

## 2022-04-08 LAB — CYTOLOGY, (ORAL, ANAL, URETHRAL) ANCILLARY ONLY
Chlamydia: NEGATIVE
Comment: NEGATIVE
Comment: NEGATIVE
Comment: NORMAL
Neisseria Gonorrhea: NEGATIVE
Trichomonas: NEGATIVE

## 2022-04-08 LAB — RPR: RPR Ser Ql: NONREACTIVE

## 2022-06-03 ENCOUNTER — Ambulatory Visit: Payer: 59 | Admitting: Family Medicine

## 2022-06-20 ENCOUNTER — Ambulatory Visit (HOSPITAL_COMMUNITY)
Admission: EM | Admit: 2022-06-20 | Discharge: 2022-06-20 | Disposition: A | Discharge status: Home / Self Care | Payer: 59 | Attending: Physician Assistant | Admitting: Physician Assistant

## 2022-06-20 ENCOUNTER — Encounter (HOSPITAL_COMMUNITY): Payer: Self-pay

## 2022-06-20 DIAGNOSIS — Z202 Contact with and (suspected) exposure to infections with a predominantly sexual mode of transmission: Secondary | ICD-10-CM | POA: Diagnosis not present

## 2022-06-20 DIAGNOSIS — Z113 Encounter for screening for infections with a predominantly sexual mode of transmission: Secondary | ICD-10-CM | POA: Insufficient documentation

## 2022-06-20 LAB — HIV ANTIBODY (ROUTINE TESTING W REFLEX): HIV Screen 4th Generation wRfx: NONREACTIVE

## 2022-06-20 NOTE — ED Provider Notes (Signed)
[place]    CSN: [id number] Arrival date & time: 06/20/22  1442      History   Chief Complaint Chief Complaint  Patient presents with   SEXUALLY TRANSMITTED DISEASE    HPI Michael Hurst is a 22 y.o. male.   Patient presents today for STI testing.  Reports that he generally gets tested every few months.  He was last tested 04/07/2022 at which point he was negative.  He has an STI in the past but reports completing treatment with no ongoing symptoms.  He is sexually active with male partners but does not consistently use condoms.  Denies any recent antibiotics in the past 90 days.  He denies any current symptoms including abdominal pain, pelvic pain, fever, nausea, vomiting, dysuria, penile discharge, genital lesions.    Past Medical History:  Diagnosis Date   Hypertension     There are no problems to display for this patient.   History reviewed. No pertinent surgical history.     Home Medications    Prior to Admission medications   Not on File    Family History Family History  Problem Relation Age of Onset   Hypertension Mother    Hypertension Father     Social History Social History   Tobacco Use   Smoking status: Never   Smokeless tobacco: Never  Vaping Use   Vaping Use: Never used  Substance Use Topics   Alcohol use: Yes    Comment: occ   Drug use: No     Allergies   Patient has no known allergies.   Review of Systems Review of Systems  Constitutional:  Negative for activity change, appetite change, fatigue and fever.  Gastrointestinal:  Negative for abdominal pain, diarrhea, nausea and vomiting.  Genitourinary:  Negative for dysuria, frequency, genital sores, penile pain and urgency.     Physical Exam Triage Vital Signs ED Triage Vitals  Enc Vitals Group     BP 06/20/22 1648 (!) 148/69     Pulse Rate 06/20/22 1648 67     Resp 06/20/22 1648 18     Temp 06/20/22 1648 (!) 97.2 F (36.2 C)     Temp Source 06/20/22  1648 Oral     SpO2 06/20/22 1648 97 %     Weight --      Height --      Head Circumference --      Peak Flow --      Pain Score 06/20/22 1649 0     Pain Loc --      Pain Edu? --      Excl. in [place]? --    No data found.  Updated Vital Signs BP (!) 148/69 (BP Location: Right Arm)   Pulse 67   Temp (!) 97.2 F (36.2 C) (Oral)   Resp 18   SpO2 97%   Visual Acuity Right Eye Distance:   Left Eye Distance:   Bilateral Distance:    Right Eye Near:   Left Eye Near:    Bilateral Near:     Physical Exam Vitals reviewed.  Constitutional:      General: He is awake.     Appearance: Normal appearance. He is well-developed. He is not ill-appearing.     Comments: Very pleasant male appears stated age in no acute distress sitting comfortably in exam room  HENT:     Head: Normocephalic and atraumatic.     Mouth/Throat:     Pharynx: No oropharyngeal exudate, posterior oropharyngeal  erythema or uvula swelling.  Cardiovascular:     Rate and Rhythm: Normal rate and regular rhythm.     Heart sounds: Normal heart sounds, S1 normal and S2 normal. No murmur heard. Pulmonary:     Effort: Pulmonary effort is normal.     Breath sounds: Normal breath sounds. No stridor. No wheezing, rhonchi or rales.     Comments: Clear to auscultation bilaterally Abdominal:     General: Bowel sounds are normal.     Palpations: Abdomen is soft.     Tenderness: There is no abdominal tenderness. There is no right CVA tenderness, left CVA tenderness, guarding or rebound.     Comments: Benign abdominal exam  Neurological:     Mental Status: He is alert.  Psychiatric:        Behavior: Behavior is cooperative.      UC Treatments / Results  Labs (all labs ordered are listed, but only abnormal results are displayed) Labs Reviewed  HIV ANTIBODY (ROUTINE TESTING W REFLEX)  RPR  CYTOLOGY, (ORAL, ANAL, URETHRAL) ANCILLARY ONLY    EKG   Radiology No results found.  Procedures Procedures (including  critical care time)  Medications Ordered in UC Medications - No data to display  Initial Impression / Assessment and Plan / UC Course  I have reviewed the triage vital signs and the nursing notes.  Pertinent labs & imaging results that were available during my care of the patient were reviewed by me and considered in my medical decision making (see chart for details).     Patient is well-appearing, afebrile, nontoxic, nontachycardic.  STI testing was obtained today and is pending.  Patient was instructed to abstain from sex until results are available.  Discussed importance of safe sex practices.  Discussed that if he develops any symptoms or has any concerns he is to return for reevaluation.  Final Clinical Impressions(s) / UC Diagnoses   Final diagnoses:  Routine screening for STI (sexually transmitted infection)     Discharge Instructions      Monitor your MyChart for your results.  We will contact you if anything is positive.  Please abstain from sex until you receive results.  You should use a condom with each sexual encounter.  If you develop any symptoms please be seen immediately.     ED Prescriptions   None    PDMP not reviewed this encounter.   Terrilee Croak, PA-C 06/20/22 1728

## 2022-06-20 NOTE — Discharge Instructions (Signed)
Monitor your MyChart for your results.  We will contact you if anything is positive.  Please abstain from sex until you receive results.  You should use a condom with each sexual encounter.  If you develop any symptoms please be seen immediately.

## 2022-06-20 NOTE — ED Triage Notes (Signed)
Patient to UC for routine STD testing. Denies any exposure or symptoms.

## 2022-06-21 LAB — CYTOLOGY, (ORAL, ANAL, URETHRAL) ANCILLARY ONLY
Chlamydia: NEGATIVE
Comment: NEGATIVE
Comment: NEGATIVE
Comment: NORMAL
Neisseria Gonorrhea: NEGATIVE
Trichomonas: NEGATIVE

## 2022-06-21 LAB — RPR: RPR Ser Ql: NONREACTIVE

## 2022-08-08 ENCOUNTER — Encounter (HOSPITAL_COMMUNITY): Payer: Self-pay | Admitting: Emergency Medicine

## 2022-08-08 ENCOUNTER — Ambulatory Visit (HOSPITAL_COMMUNITY)
Admission: EM | Admit: 2022-08-08 | Discharge: 2022-08-08 | Disposition: A | Discharge status: Home / Self Care | Payer: 59 | Attending: Family Medicine | Admitting: Family Medicine

## 2022-08-08 DIAGNOSIS — Z202 Contact with and (suspected) exposure to infections with a predominantly sexual mode of transmission: Secondary | ICD-10-CM | POA: Diagnosis present

## 2022-08-08 NOTE — Discharge Instructions (Addendum)
Staff will notify you of any positive tests on the swab

## 2022-08-08 NOTE — ED Provider Notes (Signed)
[place]    CSN: [id number] Arrival date & time: 08/08/22  [id number]      History   Chief Complaint Chief Complaint  Patient presents with  . SEXUALLY TRANSMITTED DISEASE    HPI Michael Hurst is a 22 y.o. male.   HPI Here for testing for sexually transmitted diseases.  He is not having any penile discharge or itching and no dysuria.  No fever or vomiting.  He had HIV and RPR screening last month that was negative.  Past Medical History:  Diagnosis Date  . Hypertension     There are no problems to display for this patient.   History reviewed. No pertinent surgical history.     Home Medications    Prior to Admission medications   Not on File    Family History Family History  Problem Relation Age of Onset  . Hypertension Mother   . Hypertension Father     Social History Social History   Tobacco Use  . Smoking status: Never  . Smokeless tobacco: Never  Vaping Use  . Vaping Use: Never used  Substance Use Topics  . Alcohol use: Yes    Comment: occ  . Drug use: No     Allergies   Patient has no known allergies.   Review of Systems Review of Systems   Physical Exam Triage Vital Signs ED Triage Vitals  Enc Vitals Group     BP 08/08/22 0852 (!) 151/88     Pulse Rate 08/08/22 0852 65     Resp 08/08/22 0852 15     Temp 08/08/22 0852 98.6 F (37 C)     Temp Source 08/08/22 0852 Oral     SpO2 08/08/22 0852 96 %     Weight --      Height --      Head Circumference --      Peak Flow --      Pain Score 08/08/22 0851 0     Pain Loc --      Pain Edu? --      Excl. in [place]? --    No data found.  Updated Vital Signs BP (!) 151/88 (BP Location: Right Arm)   Pulse 65   Temp 98.6 F (37 C) (Oral)   Resp 15   SpO2 96%   Visual Acuity Right Eye Distance:   Left Eye Distance:   Bilateral Distance:    Right Eye Near:   Left Eye Near:    Bilateral Near:     Physical Exam Vitals reviewed.  Constitutional:      General: He  is not in acute distress.    Appearance: He is not toxic-appearing.  Skin:    Coloration: Skin is not pale.  Neurological:     Mental Status: He is alert and oriented to person, place, and time.  Psychiatric:        Behavior: Behavior normal.      UC Treatments / Results  Labs (all labs ordered are listed, but only abnormal results are displayed) Labs Reviewed - No data to display  EKG   Radiology No results found.  Procedures Procedures (including critical care time)  Medications Ordered in UC Medications - No data to display  Initial Impression / Assessment and Plan / UC Course  I have reviewed the triage vital signs and the nursing notes.  Pertinent labs & imaging results that were available during my care of the patient were reviewed by me and considered in  my medical decision making (see chart for details).  Clinical Course as of 08/08/22 0920  Thu Aug 08, 2022  0920 Cytology (oral, anal, urethral) ancillary only [PB]    Clinical Course User Index [PB] Barrett Henle, MD       Urethral self swab is done and will result in the next 2 days.  Staff will notify him of any positives on the swab and treat per protocol. Final Clinical Impressions(s) / UC Diagnoses   Final diagnoses:  None   Discharge Instructions   None    ED Prescriptions   None    PDMP not reviewed this encounter.   Barrett Henle, MD 08/08/22 [phone]

## 2022-08-08 NOTE — ED Triage Notes (Signed)
Pt requesting STD testing. Denies exposure or s/s.  

## 2022-08-09 LAB — CYTOLOGY, (ORAL, ANAL, URETHRAL) ANCILLARY ONLY
Chlamydia: NEGATIVE
Comment: NEGATIVE
Comment: NEGATIVE
Comment: NORMAL
Neisseria Gonorrhea: NEGATIVE
Trichomonas: NEGATIVE

## 2022-08-21 ENCOUNTER — Encounter (HOSPITAL_COMMUNITY): Payer: Self-pay

## 2022-08-21 ENCOUNTER — Ambulatory Visit (HOSPITAL_COMMUNITY)
Admission: EM | Admit: 2022-08-21 | Discharge: 2022-08-21 | Disposition: A | Discharge status: Home / Self Care | Payer: 59 | Attending: Internal Medicine | Admitting: Internal Medicine

## 2022-08-21 DIAGNOSIS — R369 Urethral discharge, unspecified: Secondary | ICD-10-CM | POA: Diagnosis present

## 2022-08-21 MED ORDER — DOXYCYCLINE HYCLATE 100 MG PO CAPS: 100.0000 mg | ORAL_CAPSULE | Freq: Two times a day (BID) | ORAL | 0 refills | Status: AC

## 2022-08-21 NOTE — Discharge Instructions (Signed)
Will send your swab to the lab for testing I have sent you an antibiotic called doxycycline to your pharmacy  Take it 2x per day for 7 days Will call you if results are positive

## 2022-08-21 NOTE — ED Provider Notes (Signed)
[place]   [id number] 08/21/22 Arrival Time: 0800  ASSESSMENT & PLAN:  1. Penile discharge    -Urethral swab swab collected and sent to the lab for testing.  Was offered blood testing for HIV/RPR today but declined.  Will treat empirically for chlamydia with doxycycline 100 mg twice daily for 7 days.  Will call him if results are positive.  All questions answered and he agrees to the plan  Meds ordered this encounter  Medications   doxycycline (VIBRAMYCIN) 100 MG capsule    Sig: Take 1 capsule (100 mg total) by mouth 2 (two) times daily for 7 days.    Dispense:  14 capsule    Refill:  0     Discharge Instructions      Will send your swab to the lab for testing I have sent you an antibiotic called doxycycline to your pharmacy  Take it 2x per day for 7 days Will call you if results are positive        Reviewed expectations re: course of current medical issues. Questions answered. Outlined signs and symptoms indicating need for more acute intervention. Patient verbalized understanding. After Visit Summary given.   SUBJECTIVE: Pleasant 22 year old male comes urgent care for STD testing.  He noticed some clear/white discharge from his urethra that started yesterday.  He is not having any genital pain, dysuria, urinary frequency.  No genital lesions.  No abdominal pain.  No testicular pain.  No fevers.  He is sexually active with women only.  Of note he recently had negative cytology STD testing 13 days ago and negative blood work for RPR/HIV about 2 months ago.  But he has had both chlamydia and gonorrhea in the past before.  He says this feels similar to the last time he had chlamydia.  No LMP for male patient. History reviewed. No pertinent surgical history.   OBJECTIVE:  Vitals:   08/21/22 0813  BP: (!) 155/92  Pulse: 67  Resp: 12  Temp: 98 F (36.7 C)  TempSrc: Oral  SpO2: 98%     Physical Exam Vitals and nursing note reviewed.   Constitutional:      Appearance: Normal appearance. He is not ill-appearing.  HENT:     Head: Normocephalic.  Cardiovascular:     Rate and Rhythm: Normal rate.     Heart sounds: Normal heart sounds.  Pulmonary:     Effort: Pulmonary effort is normal.  Abdominal:     General: Abdomen is flat.     Palpations: Abdomen is soft.     Tenderness: There is no abdominal tenderness. There is no guarding.  Genitourinary:    Comments: deferred Musculoskeletal:        General: Normal range of motion.  Neurological:     General: No focal deficit present.     Mental Status: He is alert.  Psychiatric:        Mood and Affect: Mood normal.      Labs: Results for orders placed or performed during the hospital encounter of 08/08/22  Cytology (oral, anal, urethral) ancillary only  Result Value Ref Range   Neisseria Gonorrhea Negative    Chlamydia Negative    Trichomonas Negative    Comment Normal Reference Range Trichomonas - Negative    Comment Normal Reference [place] Chlamydia - Negative    Comment      Normal Reference Range Neisseria Gonorrhea - Negative   Labs Reviewed  CYTOLOGY, (ORAL, ANAL, URETHRAL) ANCILLARY ONLY    Imaging: No  results found.   No Known Allergies                                             Past Medical History:  Diagnosis Date   Hypertension     Social History   Socioeconomic History   Marital status: Single    Spouse name: Not on file   Number of children: Not on file   Years of education: Not on file   Highest education level: Not on file  Occupational History   Not on file  Tobacco Use   Smoking status: Never   Smokeless tobacco: Never  Vaping Use   Vaping Use: Never used  Substance and Sexual Activity   Alcohol use: Yes    Comment: occ   Drug use: No   Sexual activity: Yes    Birth control/protection: Condom, Other-see comments    Comment: not always  Other Topics Concern   Not on file  Social History Narrative   Not on file    Social Determinants of Health   Financial Resource Strain: Not on file  Food Insecurity: Not on file  Transportation Needs: Not on file  Physical Activity: Not on file  Stress: Not on file  Social Connections: Not on file  Intimate Partner Violence: Not on file    Family History  Problem Relation Age of Onset   Hypertension Mother    Hypertension Father       Myleka Moncure, Dorian Pod, MD 08/21/22 [phone]

## 2022-08-21 NOTE — ED Triage Notes (Signed)
Pt is here for penile discharge started yesterday .

## 2022-08-22 LAB — CYTOLOGY, (ORAL, ANAL, URETHRAL) ANCILLARY ONLY
Chlamydia: NEGATIVE
Comment: NEGATIVE
Comment: NEGATIVE
Comment: NORMAL
Neisseria Gonorrhea: NEGATIVE
Trichomonas: NEGATIVE

## 2022-09-12 ENCOUNTER — Telehealth: Payer: 59 | Admitting: Urgent Care

## 2022-09-12 DIAGNOSIS — K130 Diseases of lips: Secondary | ICD-10-CM

## 2022-09-12 DIAGNOSIS — B001 Herpesviral vesicular dermatitis: Secondary | ICD-10-CM

## 2022-09-12 MED ORDER — VALACYCLOVIR HCL 1 G PO TABS: 2000.0000 mg | ORAL_TABLET | Freq: Two times a day (BID) | ORAL | 0 refills | Status: AC

## 2022-09-12 NOTE — Progress Notes (Signed)
Virtual Visit Consent   DSHAWN MCNAY, you are scheduled for a virtual visit with a [facility] provider today. Just as with appointments in the office, your consent must be obtained to participate. Your consent will be active for this visit and any virtual visit you may have with one of our providers in the next 365 days. If you have a MyChart account, a copy of this consent can be sent to you electronically.  As this is a virtual visit, video technology does not allow for your provider to perform a traditional examination. This may limit your provider's ability to fully assess your condition. If your provider identifies any concerns that need to be evaluated in person or the need to arrange testing (such as labs, EKG, etc.), we will make arrangements to do so. Although advances in technology are sophisticated, we cannot ensure that it will always work on either your end or our end. If the connection with a video visit is poor, the visit may have to be switched to a telephone visit. With either a video or telephone visit, we are not always able to ensure that we have a secure connection.  By engaging in this virtual visit, you consent to the provision of healthcare and authorize for your insurance to be billed (if applicable) for the services provided during this visit. Depending on your insurance coverage, you may receive a charge related to this service.  I need to obtain your verbal consent now. Are you willing to proceed with your visit today? Jamen Loiseau Loschiavo has provided verbal consent on 09/12/2022 for a virtual visit (video or telephone). [name], PA  Date: 09/12/2022 4:32 PM  Virtual Visit via Video Note   I, Miri Jose L Yulian Gosney, connected with  MYREON WIMER  ([id number], [birth year]) on 09/12/22 at  4:15 PM EDT by a video-enabled telemedicine application and verified that I am speaking with the correct person using two identifiers.  Location: Patient: Virtual Visit Location Patient:  Home Provider: Virtual Visit Location Provider: Home Office   I discussed the limitations of evaluation and management by telemedicine and the availability of in person appointments. The patient expressed understanding and agreed to proceed.    History of Present Illness: LANARD ARGUIJO is a 22 y.o. who identifies as a male who was assigned male at birth, and is being seen today for lip lesions.  HPI: 22yo male presents today due to concerns of lesions on his right upper lip. States on Monday and Tuesday, he thinks he had the flu. Felt feverish with chills, didn't get out of bed. Has been taking OTC alka seltzer and feels that his symptoms are improving. Woke up this morning with several skin colored bumps to his right upper lip near the corner. No pus or drainage. Denies clear fluid. Never had something like this before. Did have some swollen lymph nodes this week in his neck, but feels those are getting better. Denies any lesions to buccal mucosa or gums. No swelling of lips or tongue. No tx tried.   Problems: There are no problems to display for this patient.   Allergies: No Known Allergies Medications:  Current Outpatient Medications:    valACYclovir (VALTREX) 1000 MG tablet, Take 2 tablets (2,000 mg total) by mouth every 12 (twelve) hours for 1 day., Disp: 4 tablet, Rfl: 0  Observations/Objective: Patient is well-developed, well-nourished in no acute distress.  Resting comfortably  at home. Non-toxic appearing. Head is normocephalic, atraumatic.  No labored breathing.  No cough. Speech is clear and coherent with logical content.  Patient is alert and oriented at baseline.  Three well circumscribed lip lesions to the right upper lateral lip, no surrounding erythema or swelling.  Assessment and Plan: 1. Fever blister  2. Lip lesion  Symptoms most concerning for cold sore given presentation and location. Pt with no prior hx of this. Will start valtrex 2g BID for total of 4 pills.  Start asap. OTC abreva discussed as another option.  Follow Up Instructions: I discussed the assessment and treatment plan with the patient. The patient was provided an opportunity to ask questions and all were answered. The patient agreed with the plan and demonstrated an understanding of the instructions.  A copy of instructions were sent to the patient via MyChart unless otherwise noted below.    The patient was advised to call back or seek an in-person evaluation if the symptoms worsen or if the condition fails to improve as anticipated.  Time:  I spent 8 minutes with the patient via telehealth technology discussing the above problems/concerns.    Jermario Kalmar L Tiarah Shisler, PA

## 2022-09-12 NOTE — Patient Instructions (Signed)
  Michael Hurst, thank you for joining Michael Bees, PA for today's virtual visit.  While this provider is not your primary care provider (PCP), if your PCP is located in our provider database this encounter information will be shared with them immediately following your visit.   A [facility] MyChart account gives you access to today's visit and all your visits, tests, and labs performed at [facility] " click here if you don't have a [facility] MyChart account or go to [email or web address]  Consent: (Patient) Michael Hurst provided verbal consent for this virtual visit at the beginning of the encounter.  Current Medications:  Current Outpatient Medications:    valACYclovir (VALTREX) 1000 MG tablet, Take 2 tablets (2,000 mg total) by mouth every 12 (twelve) hours for 1 day., Disp: 4 tablet, Rfl: 0   Medications ordered in this encounter:  Meds ordered this encounter  Medications   valACYclovir (VALTREX) 1000 MG tablet    Sig: Take 2 tablets (2,000 mg total) by mouth every 12 (twelve) hours for 1 day.    Dispense:  4 tablet    Refill:  0    Order Specific Question:   Supervising Provider    Answer:   Michael Hurst [id number]     *If you need refills on other medications prior to your next appointment, please contact your pharmacy*  Follow-Up: Call back or seek an in-person evaluation if the symptoms worsen or if the condition fails to improve as anticipated.  [facility] [phone]  Other Instructions Take two tabs (2,000mg ) of valacyclovir by mouth x 1 dose, followed by two tabs by mouth 12 hours later.  Consider OTC Abreva if symptoms persist. Do not engage in sexual activity or kiss others while lesions are active as viral sores are contagious. If your symptoms persist or new symptoms occur, please go to an in person Urgent Care for a more detailed evaluation.   If you have been instructed to have an in-person evaluation today at a  local Urgent Care facility, please use the link below. It will take you to a list of all of our available [facility], including address, phone number and hours of operation. Please do not delay care.  [facility]  If you or a family member do not have a primary care provider, use the link below to schedule a visit and establish care. When you choose a [facility] primary care physician or advanced practice provider, you gain a long-term partner in health. Find a Primary Care Provider  Learn more about [facility] in-office and virtual care options: [facility] - Get Care Now

## 2022-09-19 ENCOUNTER — Telehealth: Payer: 59 | Admitting: Physician Assistant

## 2022-09-19 ENCOUNTER — Ambulatory Visit (HOSPITAL_COMMUNITY)
Admission: EM | Admit: 2022-09-19 | Discharge: 2022-09-19 | Disposition: A | Discharge status: Home / Self Care | Payer: 59 | Attending: Nurse Practitioner | Admitting: Nurse Practitioner

## 2022-09-19 ENCOUNTER — Encounter (HOSPITAL_COMMUNITY): Payer: Self-pay

## 2022-09-19 DIAGNOSIS — R369 Urethral discharge, unspecified: Secondary | ICD-10-CM | POA: Diagnosis present

## 2022-09-19 DIAGNOSIS — R509 Fever, unspecified: Secondary | ICD-10-CM

## 2022-09-19 DIAGNOSIS — L293 Anogenital pruritus, unspecified: Secondary | ICD-10-CM

## 2022-09-19 LAB — HIV ANTIBODY (ROUTINE TESTING W REFLEX): HIV Screen 4th Generation wRfx: NONREACTIVE

## 2022-09-19 NOTE — Progress Notes (Signed)
Virtual Visit Consent   Michael Hurst, you are scheduled for a virtual visit with a [facility] provider today. Just as with appointments in the office, your consent must be obtained to participate. Your consent will be active for this visit and any virtual visit you may have with one of our providers in the next 365 days. If you have a MyChart account, a copy of this consent can be sent to you electronically.  As this is a virtual visit, video technology does not allow for your provider to perform a traditional examination. This may limit your provider's ability to fully assess your condition. If your provider identifies any concerns that need to be evaluated in person or the need to arrange testing (such as labs, EKG, etc.), we will make arrangements to do so. Although advances in technology are sophisticated, we cannot ensure that it will always work on either your end or our end. If the connection with a video visit is poor, the visit may have to be switched to a telephone visit. With either a video or telephone visit, we are not always able to ensure that we have a secure connection.  By engaging in this virtual visit, you consent to the provision of healthcare and authorize for your insurance to be billed (if applicable) for the services provided during this visit. Depending on your insurance coverage, you may receive a charge related to this service.  I need to obtain your verbal consent now. Are you willing to proceed with your visit today? Michael Hurst has provided verbal consent on 09/19/2022 for a virtual visit (video or telephone). Margaretann Loveless, PA-C  Date: 09/19/2022 8:30 AM  Virtual Visit via Video Note   I, Margaretann Loveless, connected with  Michael Hurst  ([id number], [birth year]) on 09/19/22 at  8:15 AM EDT by a video-enabled telemedicine application and verified that I am speaking with the correct person using two identifiers.  Location: Patient: Virtual Visit Location  Patient: Home Provider: Virtual Visit Location Provider: Home Office   I discussed the limitations of evaluation and management by telemedicine and the availability of in person appointments. The patient expressed understanding and agreed to proceed.    History of Present Illness: Michael Hurst is a 22 y.o. who identifies as a male who was assigned male at birth, and is being seen today for penile discharge.  HPI: Penile Discharge The patient's primary symptoms include genital itching and penile discharge (cloudy, thick, white, no odor). The patient's pertinent negatives include no genital lesions, pelvic pain, penile pain, scrotal swelling or testicular pain. This is a new problem. The current episode started in the past 7 days. The problem occurs constantly. The patient is experiencing no pain. Associated symptoms include chills and a fever.   Felt like he had flu and symptoms started same time as penile discharge and itching. Reports one sexual partner.   Problems: There are no problems to display for this patient.   Allergies: No Known Allergies Medications: No current outpatient medications on file.  Observations/Objective: Patient is well-developed, well-nourished in no acute distress.  Resting comfortably  at home.  Head is normocephalic, atraumatic.  No labored breathing.  Speech is clear and coherent with logical content.  Patient is alert and oriented at baseline.    Assessment and Plan: 1. Penile discharge  2. Itching of male genitalia  3. Fever, unspecified fever cause  - Advised in person evaluation for testing since had flu-like symptoms at same  time as discharge  Follow Up Instructions: I discussed the assessment and treatment plan with the patient. The patient was provided an opportunity to ask questions and all were answered. The patient agreed with the plan and demonstrated an understanding of the instructions.  A copy of instructions were sent to the patient  via MyChart unless otherwise noted below.    The patient was advised to call back or seek an in-person evaluation if the symptoms worsen or if the condition fails to improve as anticipated.  Time:  I spent 10 minutes with the patient via telehealth technology discussing the above problems/concerns.    Margaretann Loveless, PA-C

## 2022-09-19 NOTE — Discharge Instructions (Signed)
We will notify you with any results that are positive in the testing from today   Please use condoms with every sexual encounter to prevent STI

## 2022-09-19 NOTE — ED Triage Notes (Signed)
Here for penile discharge x 1 week. No known exposure. Pt would like STI-testing.

## 2022-09-19 NOTE — Patient Instructions (Signed)
  Michael Hurst, thank you for joining Margaretann Loveless, PA-C for today's virtual visit.  While this provider is not your primary care provider (PCP), if your PCP is located in our provider database this encounter information will be shared with them immediately following your visit.   A [facility] MyChart account gives you access to today's visit and all your visits, tests, and labs performed at [facility] " click here if you don't have a [facility] MyChart account or go to [email or web address]  Consent: (Patient) Michael Hurst provided verbal consent for this virtual visit at the beginning of the encounter.  Current Medications: No current outpatient medications on file.   Medications ordered in this encounter:  No orders of the defined types were placed in this encounter.    *If you need refills on other medications prior to your next appointment, please contact your pharmacy*  Follow-Up: Call back or seek an in-person evaluation if the symptoms worsen or if the condition fails to improve as anticipated.  [facility] Virtual [facility] [phone]  Other Instructions [address] [place], [place] Clinical Services: 1st Floor  Medical Records: [phone] Women, Infants & Children [facility]): [phone] Maternity Eligibility: [phone] Clinical Services: 2nd Floor  Fee Office: [phone] Appointments (English or Spanish): [phone] Adult Immunizations Child Immunizations [facility] Family Planning/Pregnancy Testing and Questions International Travel Program Maternal Health STI & HIV/AIDS Testing and Counseling Tuberculosis (TB): [phone] Refugee Health: [phone] Family Planning Eligibility: [phone]  3rd Floor  Medication Assistance Program (MAP): [phone]: option 2 Pharmacy: [phone] 4th Floor  Administration [facility]): [phone] Administration (Clinical Services):  [phone] Administration (Allied Health Services): [phone] Child Fatality Prevention Team (CFPT): [phone] Health Education and Media Relations- [phone] Pregnancy Care Management: [phone] Regional Vasectomy Program: [phone]   If you have been instructed to have an in-person evaluation today at a local Urgent Care facility, please use the link below. It will take you to a list of all of our available [facility], including address, phone number and hours of operation. Please do not delay care.  [facility]  If you or a family member do not have a primary care provider, use the link below to schedule a visit and establish care. When you choose a [facility] primary care physician or advanced practice provider, you gain a long-term partner in health. Find a Primary Care Provider  Learn more about [facility]'s in-office and virtual care options: [facility] - Get Care Now

## 2022-09-19 NOTE — ED Provider Notes (Signed)
[facility]    CSN: [id number] Arrival date & time: 09/19/22  1354      History   Chief Complaint Chief Complaint  Patient presents with   Penile Discharge    HPI Michael Hurst is a 22 y.o. male.   Patient presents today for STI testing.  Reports he has had penile discharge for the past week.  Describes the penile discharges thin and white.  No burning with urination,  increased urinary frequency or urgency.  No hematuria.  No known exposures to STI.  No scrotal pain, penile sores, rashes, or lesions.  No abdominal pain or swelling in the groin.  Reports over the past week, he has also had flulike symptoms that have since resolved.    Past Medical History:  Diagnosis Date   Hypertension     There are no problems to display for this patient.   History reviewed. No pertinent surgical history.     Home Medications    Prior to Admission medications   Not on File    Family History Family History  Problem Relation Age of Onset   Hypertension Mother    Hypertension Father     Social History Social History   Tobacco Use   Smoking status: Never   Smokeless tobacco: Never  Vaping Use   Vaping Use: Never used  Substance Use Topics   Alcohol use: Yes    Comment: occ   Drug use: No     Allergies   Patient has no known allergies.   Review of Systems Review of Systems Per HPI  Physical Exam Triage Vital Signs ED Triage Vitals [09/19/22 1407]  Enc Vitals Group     BP      Pulse Rate 62     Resp 16     Temp 98.2 F (36.8 C)     Temp Source Oral     SpO2 98 %     Weight      Height      Head Circumference      Peak Flow      Pain Score      Pain Loc      Pain Edu?      Excl. in GC?    No data found.  Updated Vital Signs Pulse 62   Temp 98.2 F (36.8 C) (Oral)   Resp 16   SpO2 98%   Visual Acuity Right Eye Distance:   Left Eye Distance:   Bilateral Distance:    Right Eye Near:   Left Eye Near:    Bilateral Near:      Physical Exam Vitals and nursing note reviewed.  Constitutional:      General: He is not in acute distress.    Appearance: Normal appearance. He is not toxic-appearing.  Pulmonary:     Effort: Pulmonary effort is normal. No respiratory distress.  Genitourinary:    Comments: Deferred - self swab by patient Skin:    General: Skin is warm and dry.     Capillary Refill: Capillary refill takes less than 2 seconds.     Coloration: Skin is not jaundiced or pale.  Neurological:     Mental Status: He is alert and oriented to person, place, and time.     Motor: No weakness.     Gait: Gait normal.  Psychiatric:        Behavior: Behavior is cooperative.      UC Treatments / Results  Labs (all labs ordered are  listed, but only abnormal results are displayed) Labs Reviewed  RPR  HIV ANTIBODY (ROUTINE TESTING W REFLEX)  CYTOLOGY, (ORAL, ANAL, URETHRAL) ANCILLARY ONLY    EKG   Radiology No results found.  Procedures Procedures (including critical care time)  Medications Ordered in UC Medications - No data to display  Initial Impression / Assessment and Plan / UC Course  I have reviewed the triage vital signs and the nursing notes.  Pertinent labs & imaging results that were available during my care of the patient were reviewed by me and considered in my medical decision making (see chart for details).   Patient is well-appearing, afebrile, not tachycardic, not tachypneic, oxygenating well on room air.    1. Penile discharge Review of chart shows most recent cytology 08/21/2022 and 08/08/2022 as well as 06/20/2022 were all negative Cytology testing obtained today, will defer treatment until results return HIV and syphilis testing also obtained today per patient request Recommended condom use with every sexual encounter and condoms given today  The patient was given the opportunity to ask questions.  All questions answered to their satisfaction.  The patient is in agreement to  this plan.    Final Clinical Impressions(s) / UC Diagnoses   Final diagnoses:  Penile discharge     Discharge Instructions      We will notify you with any results that are positive in the testing from today   Please use condoms with every sexual encounter to prevent STI     ED Prescriptions   None    PDMP not reviewed this encounter.   Valentino Nose, NP 09/19/22 [phone]

## 2022-09-20 LAB — CYTOLOGY, (ORAL, ANAL, URETHRAL) ANCILLARY ONLY
Chlamydia: NEGATIVE
Comment: NEGATIVE
Comment: NEGATIVE
Comment: NORMAL
Neisseria Gonorrhea: NEGATIVE
Trichomonas: NEGATIVE

## 2022-09-20 LAB — RPR: RPR Ser Ql: NONREACTIVE

## 2022-12-08 ENCOUNTER — Ambulatory Visit (HOSPITAL_COMMUNITY)
Admission: EM | Admit: 2022-12-08 | Discharge: 2022-12-08 | Disposition: A | Discharge status: Home / Self Care | Payer: 59 | Attending: Emergency Medicine | Admitting: Emergency Medicine

## 2022-12-08 ENCOUNTER — Encounter (HOSPITAL_COMMUNITY): Payer: Self-pay

## 2022-12-08 DIAGNOSIS — Z113 Encounter for screening for infections with a predominantly sexual mode of transmission: Secondary | ICD-10-CM | POA: Insufficient documentation

## 2022-12-08 LAB — RPR: RPR Ser Ql: NONREACTIVE

## 2022-12-08 LAB — HIV ANTIBODY (ROUTINE TESTING W REFLEX): HIV Screen 4th Generation wRfx: NONREACTIVE

## 2022-12-08 NOTE — ED Triage Notes (Signed)
Patient here today to be tested for all STDs. He is not having any symptoms.

## 2022-12-08 NOTE — ED Provider Notes (Signed)
[facility]    CSN: [id number] Arrival date & time: 12/08/22  1144      History   Chief Complaint Chief Complaint  Patient presents with   STD testing    HPI Michael Hurst is a 22 y.o. male.   Patient presents for evaluation of intermittent dryness and pruritus to the perineum present for 7 days.  Has recently changed soaps.  Denies drainage, rash or lesion, denies penile drainage, penile or testicle swelling or urinary symptoms.  Denies recent sexual activity however 2 partners within the last 1 to 2 months, no known exposures.  Has not attempted treatment of symptoms.    Past Medical History:  Diagnosis Date   Hypertension     There are no problems to display for this patient.   History reviewed. No pertinent surgical history.     Home Medications    Prior to Admission medications   Not on File    Family History Family History  Problem Relation Age of Onset   Hypertension Mother    Hypertension Father     Social History Social History   Tobacco Use   Smoking status: Never   Smokeless tobacco: Never  Vaping Use   Vaping status: Never Used  Substance Use Topics   Alcohol use: Yes    Comment: occ   Drug use: No     Allergies   Patient has no known allergies.   Review of Systems Review of Systems   Physical Exam Triage Vital Signs ED Triage Vitals [12/08/22 1205]  Encounter Vitals Group     BP 131/84     Systolic BP Percentile      Diastolic BP Percentile      Pulse Rate 77     Resp 16     Temp 98.1 F (36.7 C)     Temp Source Oral     SpO2 96 %     Weight 255 lb (115.7 kg)     Height 6\' 3"  (1.905 m)     Head Circumference      Peak Flow      Pain Score 0     Pain Loc      Pain Education      Exclude from Growth Chart    No data found.  Updated Vital Signs BP 131/84 (BP Location: Right Arm)   Pulse 77   Temp 98.1 F (36.7 C) (Oral)   Resp 16   Ht 6\' 3"  (1.905 m)   Wt 255 lb (115.7 kg)   SpO2 96%   BMI  31.87 kg/m   Visual Acuity Right Eye Distance:   Left Eye Distance:   Bilateral Distance:    Right Eye Near:   Left Eye Near:    Bilateral Near:     Physical Exam Constitutional:      Appearance: Normal appearance.  Eyes:     Extraocular Movements: Extraocular movements intact.  Pulmonary:     Effort: Pulmonary effort is normal.  Genitourinary:    Comments: Raised follicular bumps across the perineum without erythema or drainage, nontender, no abnormalities to the penis or testicles Neurological:     Mental Status: He is alert and oriented to person, place, and time. Mental status is at baseline.      UC Treatments / Results  Labs (all labs ordered are listed, but only abnormal results are displayed) Labs Reviewed  HIV ANTIBODY (ROUTINE TESTING W REFLEX)  RPR  CYTOLOGY, (ORAL, ANAL, URETHRAL) ANCILLARY ONLY  EKG   Radiology No results found.  Procedures Procedures (including critical care time)  Medications Ordered in UC Medications - No data to display  Initial Impression / Assessment and Plan / UC Course  I have reviewed the triage vital signs and the nursing notes.  Pertinent labs & imaging results that were available during my care of the patient were reviewed by me and considered in my medical decision making (see chart for details).  Routine screening for STI  No signs of infection, defer use of antibiotics at this time, recommended hydrocortisone cream for treatment, STI labs are pending, will treat protocol, advised abstinence until lab results, treatment is complete and all symptoms are resolved, advised condom use during all encounters Final Clinical Impressions(s) / UC Diagnoses   Final diagnoses:  Routine screening for STI (sexually transmitted infection)     Discharge Instructions      Labs pending 2-3 days, you will be contacted if positive for any sti and treatment will be sent to the pharmacy, you will have to return to the clinic if  positive for gonorrhea to receive treatment   Please refrain from having sex until labs results, if positive please refrain from having sex until treatment complete and symptoms resolve   If positive for HIV, Syphilis, Chlamydia  gonorrhea or trichomoniasis please notify partner or partners so they may tested as well  Moving forward, it is recommended you use some form of protection against the transmission of sti infections  such as condoms or dental dams with each sexual encounter     ED Prescriptions   None    PDMP not reviewed this encounter.   Valinda Hoar, NP 12/08/22 1250

## 2022-12-08 NOTE — Discharge Instructions (Addendum)
On exam there are fine hair bumps across her pelvis however these at this time do not look inflamed or infected, could be experiencing irritation due to the razor, apply topical hydrocortisone cream every morning and every evening to see if this is helpful, may purchase this medicine over-the-counter, may return urgent care if any new symptoms begin such as redness, severe itching, drainage, pain etc.  Labs pending 2-3 days, you will be contacted if positive for any sti and treatment will be sent to the pharmacy, you will have to return to the clinic if positive for gonorrhea to receive treatment   Please refrain from having sex until labs results, if positive please refrain from having sex until treatment complete and symptoms resolve   If positive for HIV, Syphilis, Chlamydia  gonorrhea or trichomoniasis please notify partner or partners so they may tested as well  Moving forward, it is recommended you use some form of protection against the transmission of sti infections  such as condoms or dental dams with each sexual encounter

## 2022-12-09 LAB — CYTOLOGY, (ORAL, ANAL, URETHRAL) ANCILLARY ONLY
Chlamydia: NEGATIVE
Comment: NEGATIVE
Comment: NEGATIVE
Comment: NORMAL
Neisseria Gonorrhea: NEGATIVE
Trichomonas: NEGATIVE

## 2023-04-01 ENCOUNTER — Encounter (HOSPITAL_COMMUNITY): Payer: Self-pay

## 2023-04-01 ENCOUNTER — Ambulatory Visit (HOSPITAL_COMMUNITY)
Admission: EM | Admit: 2023-04-01 | Discharge: 2023-04-01 | Disposition: A | Discharge status: Home / Self Care | Payer: 59 | Attending: Emergency Medicine | Admitting: Emergency Medicine

## 2023-04-01 DIAGNOSIS — Z113 Encounter for screening for infections with a predominantly sexual mode of transmission: Secondary | ICD-10-CM | POA: Insufficient documentation

## 2023-04-01 NOTE — Discharge Instructions (Signed)
We will call you if anything on your swab returns positive. You can also see these results on MyChart. Please abstain from sexual intercourse until your results return.

## 2023-04-01 NOTE — ED Provider Notes (Signed)
[facility]    CSN: [id number] Arrival date & time: 04/01/23  0803      History   Chief Complaint Chief Complaint  Patient presents with   SEXUALLY TRANSMITTED DISEASE    HPI Michael Hurst is a 22 y.o. male.  STD testing No known exposure. Not having symptoms Had blood work 3 months ago  Past Medical History:  Diagnosis Date   Hypertension     There are no problems to display for this patient.   History reviewed. No pertinent surgical history.     Home Medications    Prior to Admission medications   Not on File    Family History Family History  Problem Relation Age of Onset   Hypertension Mother    Hypertension Father     Social History Social History   Tobacco Use   Smoking status: Never   Smokeless tobacco: Never  Vaping Use   Vaping status: Never Used  Substance Use Topics   Alcohol use: Yes    Comment: occ   Drug use: No     Allergies   Patient has no known allergies.   Review of Systems Review of Systems   Physical Exam Triage Vital Signs ED Triage Vitals [04/01/23 0823]  Encounter Vitals Group     BP (!) 168/93     Systolic BP Percentile      Diastolic BP Percentile      Pulse Rate 73     Resp 16     Temp 98.2 F (36.8 C)     Temp Source Oral     SpO2 98 %     Weight      Height      Head Circumference      Peak Flow      Pain Score      Pain Loc      Pain Education      Exclude from Growth Chart    No data found.  Updated Vital Signs BP (!) 168/93 (BP Location: Left Arm)   Pulse 73   Temp 98.2 F (36.8 C) (Oral)   Resp 16   SpO2 98%   Visual Acuity Right Eye Distance:   Left Eye Distance:   Bilateral Distance:    Right Eye Near:   Left Eye Near:    Bilateral Near:     Physical Exam Vitals and nursing note reviewed.  Constitutional:      General: He is not in acute distress.    Appearance: Normal appearance.  Cardiovascular:     Rate and Rhythm: Normal rate and regular rhythm.   Pulmonary:     Effort: Pulmonary effort is normal.  Neurological:     Mental Status: He is alert and oriented to person, place, and time.      UC Treatments / Results  Labs (all labs ordered are listed, but only abnormal results are displayed) Labs Reviewed  CYTOLOGY, (ORAL, ANAL, URETHRAL) ANCILLARY ONLY    EKG   Radiology No results found.  Procedures Procedures (including critical care time)  Medications Ordered in UC Medications - No data to display  Initial Impression / Assessment and Plan / UC Course  I have reviewed the triage vital signs and the nursing notes.  Pertinent labs & imaging results that were available during my care of the patient were reviewed by me and considered in my medical decision making (see chart for details).  Cytology swab pending Safe sex practices No questions at this  time  Final Clinical Impressions(s) / UC Diagnoses   Final diagnoses:  Screen for STD (sexually transmitted disease)     Discharge Instructions      We will call you if anything on your swab returns positive. You can also see these results on MyChart. Please abstain from sexual intercourse until your results return    ED Prescriptions   None    PDMP not reviewed this encounter.   Kathrine Haddock 04/01/23 1610

## 2023-04-01 NOTE — ED Triage Notes (Signed)
Pt presents to the office for sti-tesing. Denies any symptoms at this time.

## 2023-04-02 ENCOUNTER — Telehealth: Payer: Self-pay

## 2023-04-02 LAB — CYTOLOGY, (ORAL, ANAL, URETHRAL) ANCILLARY ONLY
Chlamydia: NEGATIVE
Comment: NEGATIVE
Comment: NEGATIVE
Comment: NORMAL
Neisseria Gonorrhea: NEGATIVE
Trichomonas: POSITIVE — AB

## 2023-04-02 MED ORDER — METRONIDAZOLE 500 MG PO TABS: 2000.0000 mg | ORAL_TABLET | Freq: Once | ORAL | 0 refills | Status: AC

## 2023-04-02 NOTE — Telephone Encounter (Signed)
Per protocol, pt requires tx with metronidazole. Attempted to reach patient x1. Unable to LVM.  Rx sent to pharmacy on file.

## 2023-04-10 IMAGING — DX DG CHEST 2V
3 series · 3 of 3 positions shown · non-contrast
Comparison: None.

CLINICAL DATA: Shortness of breath.  Productive cough.

EXAM:
CHEST - 2 VIEW

[chest pa (1 of 2)]
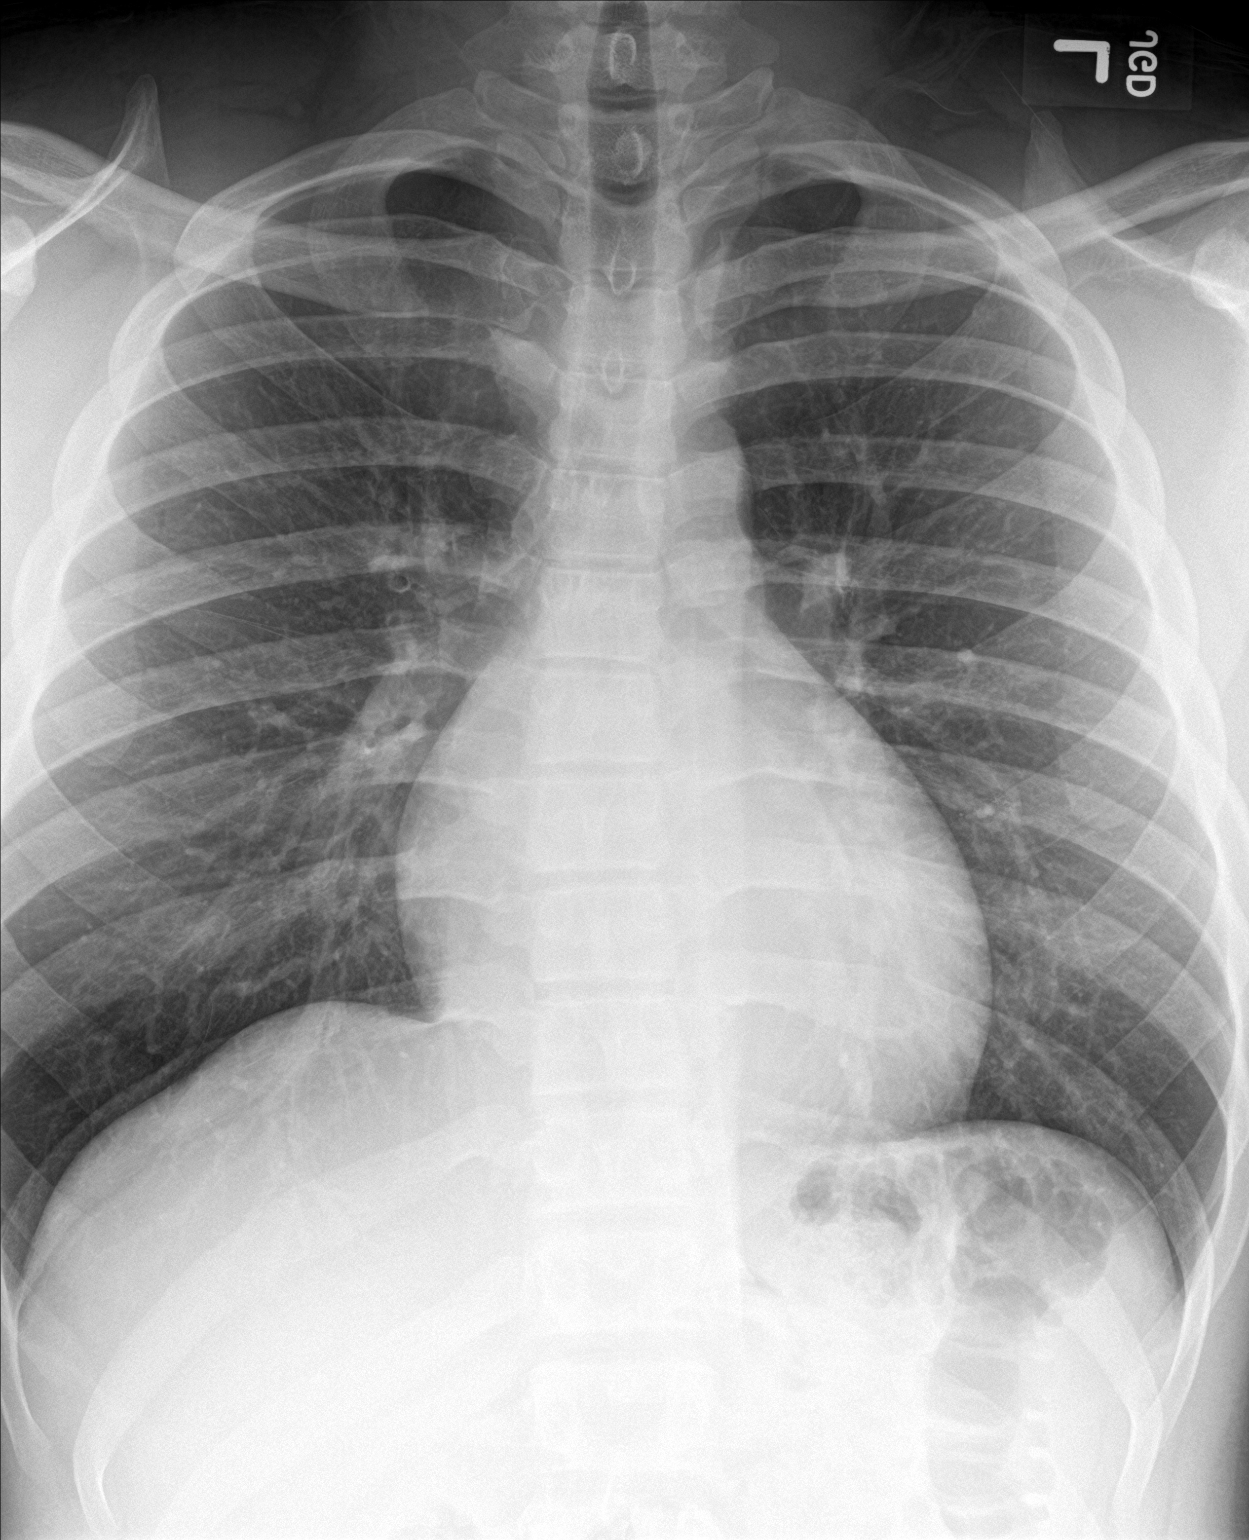

[chest lat]
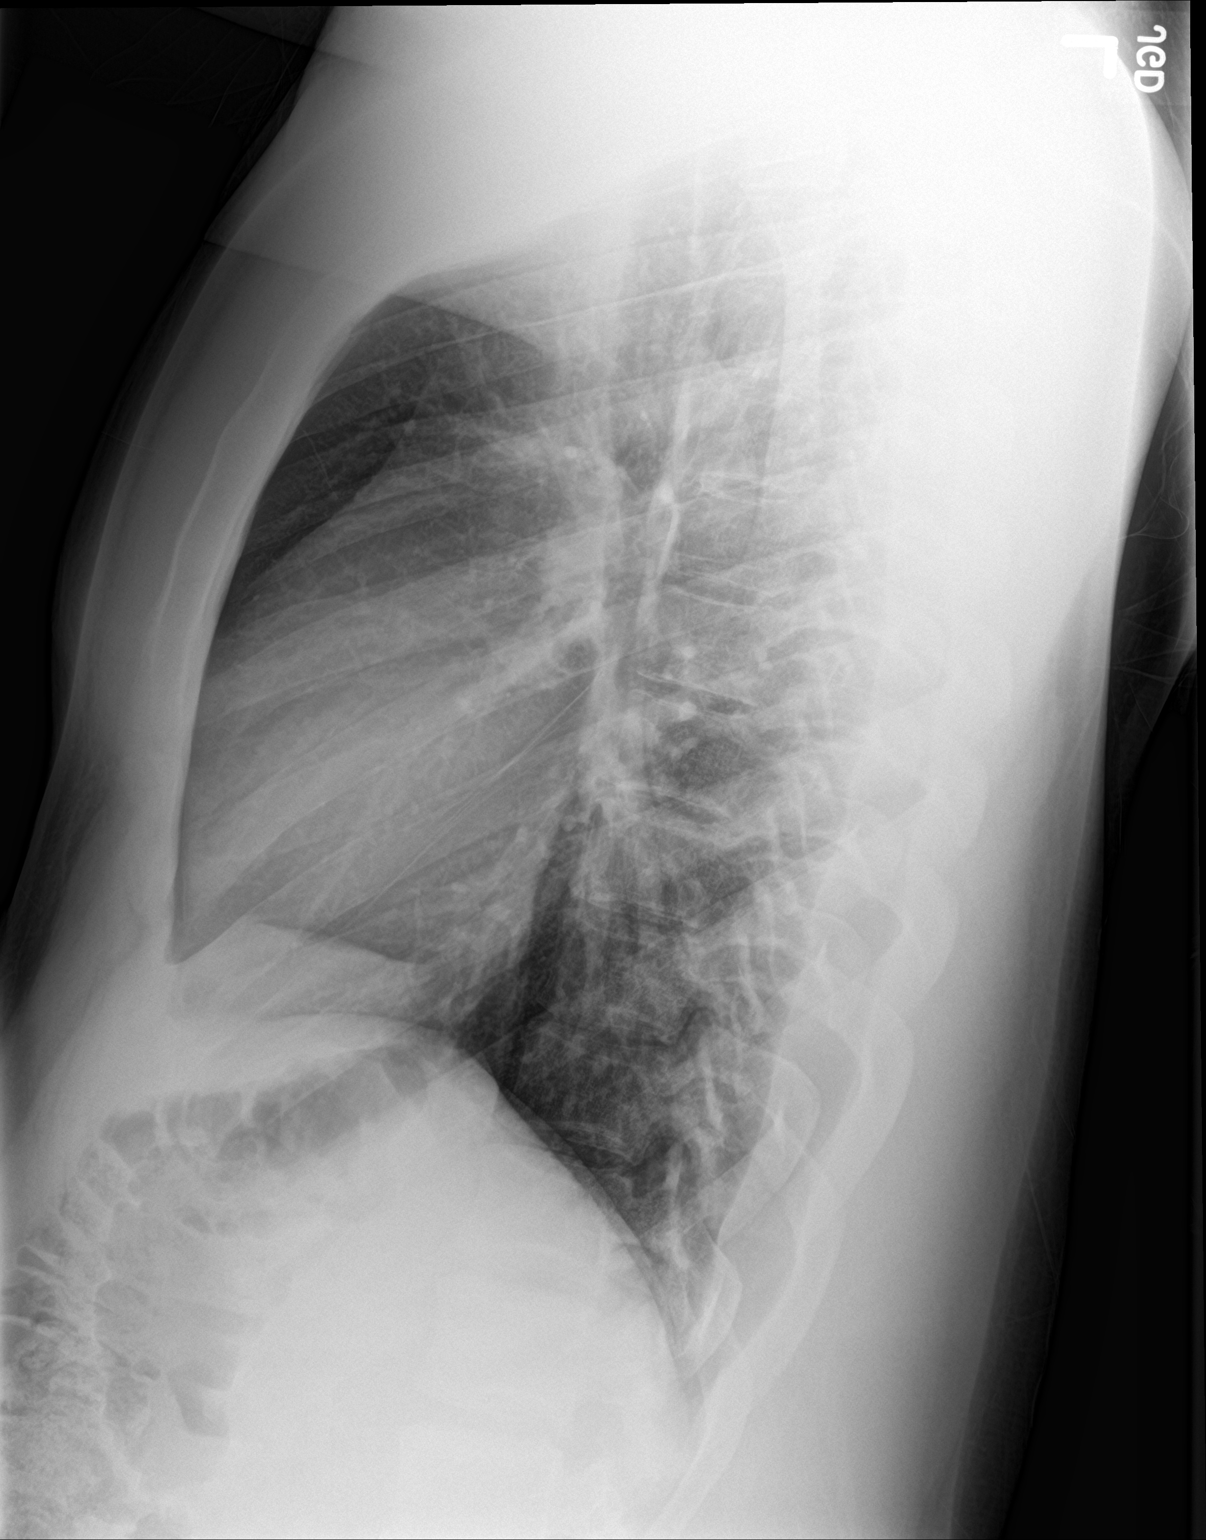

[chest pa (2 of 2)]
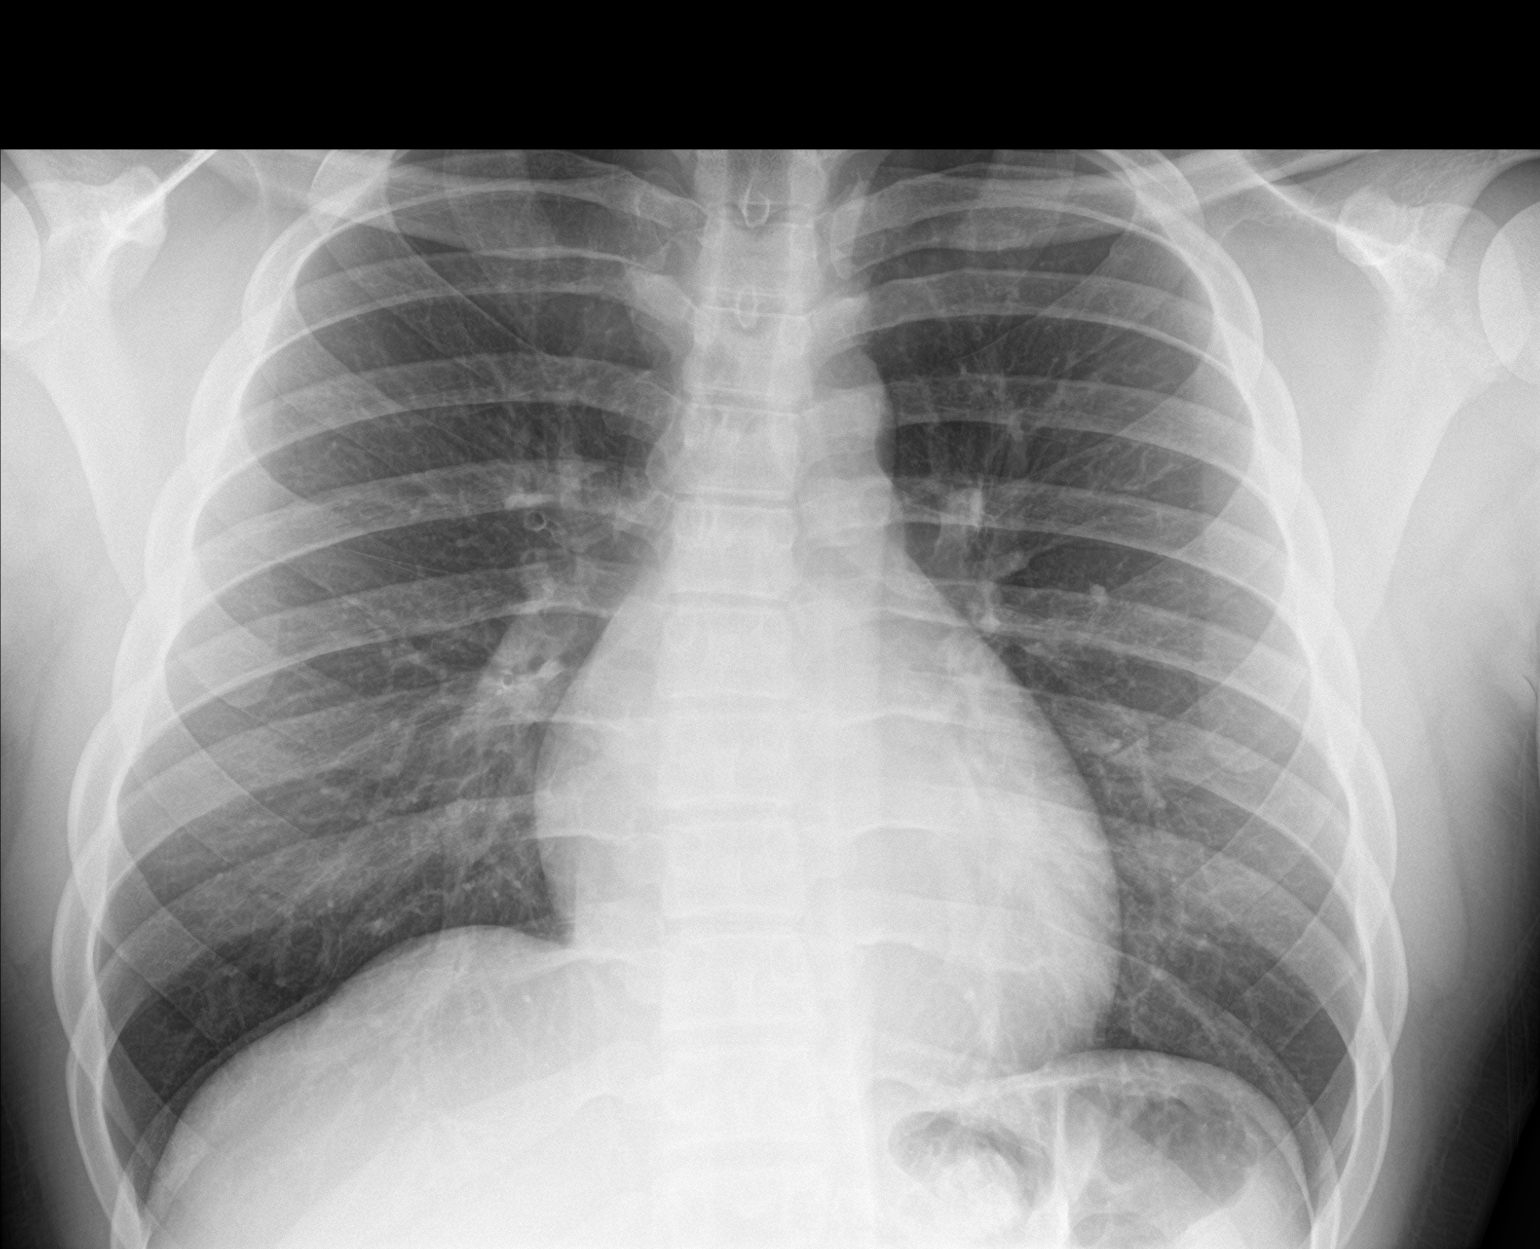

[3 of 3 positions shown; findings below may reference images not displayed]

FINDINGS: The heart size and mediastinal contours are within normal limits.
Both lungs are clear. The visualized skeletal structures are
unremarkable.
IMPRESSION: No active cardiopulmonary disease.

## 2023-04-29 ENCOUNTER — Ambulatory Visit: Payer: 59

## 2023-04-30 ENCOUNTER — Other Ambulatory Visit: Payer: Self-pay

## 2023-04-30 ENCOUNTER — Ambulatory Visit
Admission: RE | Admit: 2023-04-30 | Discharge: 2023-04-30 | Disposition: A | Discharge status: Home / Self Care | Payer: 59 | Source: Ambulatory Visit | Attending: Family Medicine | Admitting: Family Medicine

## 2023-04-30 VITALS — BP 164/93 | HR 78 | Temp 98.0°F | Resp 16

## 2023-04-30 DIAGNOSIS — Z9189 Other specified personal risk factors, not elsewhere classified: Secondary | ICD-10-CM | POA: Diagnosis present

## 2023-04-30 NOTE — ED Provider Notes (Signed)
[facility]    CSN: [id number] Arrival date & time: 04/30/23  1123      History   Chief Complaint Chief Complaint  Patient presents with   SEXUALLY TRANSMITTED DISEASE    HPI Michael Hurst is a 22 y.o. male.   Patient is asymptomatic.  He comes in regularly for sexually transmitted disease testing.  He is sexually active and does not use condoms.    Past Medical History:  Diagnosis Date   Hypertension     There are no problems to display for this patient.   History reviewed. No pertinent surgical history.     Home Medications    Prior to Admission medications   Not on File    Family History Family History  Problem Relation Age of Onset   Hypertension Mother    Hypertension Father     Social History Social History   Tobacco Use   Smoking status: Never   Smokeless tobacco: Never  Vaping Use   Vaping status: Never Used  Substance Use Topics   Alcohol use: Yes    Comment: occ   Drug use: No     Allergies   Patient has no known allergies.   Review of Systems Review of Systems See HPI.  Physical Exam Triage Vital Signs ED Triage Vitals  Encounter Vitals Group     BP 04/30/23 1127 (!) 164/93     Systolic BP Percentile --      Diastolic BP Percentile --      Pulse Rate 04/30/23 1127 78     Resp 04/30/23 1127 16     Temp 04/30/23 1127 98 F (36.7 C)     Temp Source 04/30/23 1127 Oral     SpO2 04/30/23 1127 98 %     Weight --      Height --      Head Circumference --      Peak Flow --      Pain Score 04/30/23 1129 0     Pain Loc --      Pain Education --      Exclude from Growth Chart --    No data found.  Updated Vital Signs BP (!) 164/93   Pulse 78   Temp 98 F (36.7 C) (Oral)   Resp 16   SpO2 98%      Physical Exam Vitals reviewed.      UC Treatments / Results  Labs (all labs ordered are listed, but only abnormal results are displayed) Labs Reviewed  RPR  HIV ANTIBODY (ROUTINE TESTING W REFLEX)   CYTOLOGY, (ORAL, ANAL, URETHRAL) ANCILLARY ONLY    EKG   Radiology No results found.  Procedures Procedures (including critical care time)  Medications Ordered in UC Medications - No data to display  Initial Impression / Assessment and Plan / UC Course  I have reviewed the triage vital signs and the nursing notes.  Pertinent labs & imaging results that were available during my care of the patient were reviewed by me and considered in my medical decision making (see chart for details).     Patient here for testing only. Final Clinical Impressions(s) / UC Diagnoses   Final diagnoses:  At risk for sexually transmitted disease due to unprotected sex     Discharge Instructions      Your results will be available on MyChart  You will be called if any of your test results are positive   ED Prescriptions   None  PDMP not reviewed this encounter.   Eustace Moore, MD 04/30/23 [phone]

## 2023-04-30 NOTE — ED Triage Notes (Signed)
Here for sti testing, wants bloodwork too.

## 2023-04-30 NOTE — Discharge Instructions (Signed)
Your results will be available on MyChart  You will be called if any of your test results are positive

## 2023-05-01 ENCOUNTER — Telehealth: Payer: Self-pay

## 2023-05-01 LAB — CYTOLOGY, (ORAL, ANAL, URETHRAL) ANCILLARY ONLY
Chlamydia: NEGATIVE
Comment: NEGATIVE
Comment: NEGATIVE
Comment: NORMAL
Neisseria Gonorrhea: NEGATIVE
Trichomonas: POSITIVE — AB

## 2023-05-01 LAB — RPR: RPR Ser Ql: NONREACTIVE

## 2023-05-01 LAB — HIV ANTIBODY (ROUTINE TESTING W REFLEX): HIV Screen 4th Generation wRfx: NONREACTIVE

## 2023-05-02 ENCOUNTER — Telehealth: Payer: Self-pay

## 2023-05-02 MED ORDER — METRONIDAZOLE 500 MG PO TABS: 2000.0000 mg | ORAL_TABLET | Freq: Once | ORAL | 0 refills | Status: AC

## 2023-05-02 NOTE — Telephone Encounter (Signed)
Metronidazole 500mg  tablets x 4 tablets for total dose of 2000mg  sent in per Dr. Delton See order

## 2023-05-31 ENCOUNTER — Encounter (HOSPITAL_COMMUNITY): Payer: Self-pay

## 2023-05-31 ENCOUNTER — Ambulatory Visit (HOSPITAL_COMMUNITY)
Admission: EM | Admit: 2023-05-31 | Discharge: 2023-05-31 | Disposition: A | Discharge status: Home / Self Care | Payer: 59 | Attending: Family Medicine | Admitting: Family Medicine

## 2023-05-31 DIAGNOSIS — I10 Essential (primary) hypertension: Secondary | ICD-10-CM

## 2023-05-31 DIAGNOSIS — Z113 Encounter for screening for infections with a predominantly sexual mode of transmission: Secondary | ICD-10-CM

## 2023-05-31 DIAGNOSIS — M5441 Lumbago with sciatica, right side: Secondary | ICD-10-CM

## 2023-05-31 MED ORDER — DEXAMETHASONE SODIUM PHOSPHATE 10 MG/ML IJ SOLN
10.0000 mg | Freq: Once | INTRAMUSCULAR | Status: AC
  Administered 2023-05-31: 10 mg via INTRAMUSCULAR

## 2023-05-31 MED ORDER — DEXAMETHASONE SODIUM PHOSPHATE 10 MG/ML IJ SOLN
INTRAMUSCULAR | Status: AC
  Filled 2023-05-31: qty 1

## 2023-05-31 NOTE — Discharge Instructions (Addendum)
 HOME CARE INSTRUCTIONS: For many people, back pain returns. Since low back pain is rarely dangerous, it is often a condition that people can learn to manage on their own. Please remain active. It is stressful on the back to sit or stand in one place. Do not sit, drive, or stand in one place for more than 30 minutes at a time. Take short walks on level surfaces as soon as pain allows. Try to increase the length of time you walk each day. Do not stay in bed. Resting more than 1 or 2 days can delay your recovery. Do not avoid exercise or work. Your body is made to move. It is not dangerous to be active, even though your back may hurt. Your back will likely heal faster if you return to being active before your pain is gone. Over-the-counter medicines to reduce pain and inflammation are often the most helpful.  SEEK MEDICAL CARE IF: You have pain that is not relieved with rest or medicine. You have pain that does not improve in 1 week. You have new symptoms. You are generally not feeling well.  SEEK IMMEDIATE MEDICAL CARE IF: You have pain that radiates from your back into your legs. You develop new bowel or bladder control problems. You have unusual weakness or numbness in your arms or legs. You develop nausea or vomiting. You develop abdominal pain. You feel faint.  We have sent testing for sexually transmitted infections. We will notify you of any positive results once they are received. If required, we will prescribe any medications you might need.  Please refrain from all sexual activity for at least the next seven days.  Your blood pressure was noted to be elevated during your visit today. If you are currently taking medication for high blood pressure, please ensure you are taking this as directed. If you do not have a history of high blood pressure and your blood pressure remains persistently elevated, you may need to begin taking a medication at some point. You may return here within the next  few days to recheck if unable to see your primary care provider or if you do not have a one.  BP (!) 154/91 (BP Location: Left Arm)   Pulse 71   Temp 97.9 F (36.6 C) (Oral)   Resp 16   SpO2 97%   BP Readings from Last 3 Encounters:  05/31/23 (!) 154/91  04/30/23 (!) 164/93  04/01/23 (!) 168/93

## 2023-05-31 NOTE — ED Triage Notes (Addendum)
 Patient c/o right lower back that radiates down the right leg. Patient states this happens when he plays basketball and did so 4 days ago.  Patient is also requesting an STD test and states he has no symptoms.   Patient has been taking ES Tylenol  and Ibuprofen and last took yesterday.

## 2023-06-02 LAB — CYTOLOGY, (ORAL, ANAL, URETHRAL) ANCILLARY ONLY
Chlamydia: NEGATIVE
Comment: NEGATIVE
Comment: NEGATIVE
Comment: NORMAL
Neisseria Gonorrhea: NEGATIVE
Trichomonas: NEGATIVE

## 2023-06-02 NOTE — ED Provider Notes (Signed)
 [facility]   [id number] 05/31/23 Arrival Time: 1013  ASSESSMENT & PLAN:  1. Acute right-sided low back pain with right-sided sciatica   2. Screening for STDs (sexually transmitted diseases)   3. Elevated blood pressure reading in office with diagnosis of hypertension    Able to ambulate here and hemodynamically stable. No indication for imaging of back at this time given no trauma and normal neurological exam. Discussed.  Trial of: Meds ordered this encounter  Medications   dexamethasone  (DECADRON ) injection 10 mg   Work/school excuse note: declined. Medication sedation precautions given. Encourage ROM/movement as tolerated.  Recommend:  Follow-up Information     Schedule an appointment as soon as possible for a visit  with  [facility].   Why: [address] [place], [place] [address]  [phone] Contact information: [address] [place]  [address]               Urethral cytology/HIV/RPR pending.      Discharge Instructions      HOME CARE INSTRUCTIONS: For many people, back pain returns. Since low back pain is rarely dangerous, it is often a condition that people can learn to manage on their own. Please remain active. It is stressful on the back to sit or stand in one place. Do not sit, drive, or stand in one place for more than 30 minutes at a time. Take short walks on level surfaces as soon as pain allows. Try to increase the length of time you walk each day. Do not stay in bed. Resting more than 1 or 2 days can delay your recovery. Do not avoid exercise or work. Your body is made to move. It is not dangerous to be active, even though your back may hurt. Your back will likely heal faster if you return to being active before your pain is gone. Over-the-counter medicines to reduce pain and inflammation are often the most helpful.  SEEK MEDICAL CARE IF: You have pain that is not relieved  with rest or medicine. You have pain that does not improve in 1 week. You have new symptoms. You are generally not feeling well.  SEEK IMMEDIATE MEDICAL CARE IF: You have pain that radiates from your back into your legs. You develop new bowel or bladder control problems. You have unusual weakness or numbness in your arms or legs. You develop nausea or vomiting. You develop abdominal pain. You feel faint.  We have sent testing for sexually transmitted infections. We will notify you of any positive results once they are received. If required, we will prescribe any medications you might need.  Please refrain from all sexual activity for at least the next seven days.  Your blood pressure was noted to be elevated during your visit today. If you are currently taking medication for high blood pressure, please ensure you are taking this as directed. If you do not have a history of high blood pressure and your blood pressure remains persistently elevated, you may need to begin taking a medication at some point. You may return here within the next few days to recheck if unable to see your primary care provider or if you do not have a one.  BP (!) 154/91 (BP Location: Left Arm)   Pulse 71   Temp 97.9 F (36.6 C) (Oral)   Resp 16   SpO2 97%   BP Readings from Last 3 Encounters:  05/31/23 (!) 154/91  04/30/23 (!) 164/93  04/01/23 (!) 168/93       Reviewed expectations re: course of current medical issues. Questions answered. Outlined signs and symptoms indicating need for more acute intervention. Patient verbalized understanding. After Visit Summary given.   SUBJECTIVE: History from: patient.  Michael Hurst is a 23 y.o. male who c/o right lower back that radiates down the right leg. Patient states this happens when he plays basketball. Last played 4 d ago. Symptoms lasting a little longer than usual. Normal bowel/bladder habits. Tylenol  with mild help. No extremity sensation changes  or weakness.  Patient is also requesting an STD test. Denies symptoms. Increased blood pressure noted today. Reports that he has been treated for hypertension in the past. Currently not treated.   OBJECTIVE:  Vitals:   05/31/23 1040  BP: (!) 154/91  Pulse: 71  Resp: 16  Temp: 97.9 F (36.6 C)  TempSrc: Oral  SpO2: 97%    General appearance: alert; no distress HEENT: [place]; AT Neck: supple with FROM; without midline tenderness CV: regular Lungs: unlabored respirations; speaks full sentences without difficulty Abdomen: soft, non-tender; non-distended Back: mild  and poorly localized tenderness to palpation over R lower paraspinal musculature extending toward R buttock; reports R post leg pain with SLR ; FROM at waist; bruising: none; without midline tenderness Extremities: without edema; symmetrical without gross deformities; normal ROM of bilateral LE Skin: warm and dry Neurologic: normal gait; normal sensation and strength of bilateral LE Psychological: alert and cooperative; normal mood and affect  Labs:  Labs Reviewed  CYTOLOGY, (ORAL, ANAL, URETHRAL) ANCILLARY ONLY    Imaging: No results found.  Allergies  Allergen Reactions   Other     Cashews    Past Medical History:  Diagnosis Date   Hypertension    Social History   Socioeconomic History   Marital status: Single    Spouse name: Not on file   Number of children: Not on file   Years of education: Not on file   Highest education level: Not on file  Occupational History   Not on file  Tobacco Use   Smoking status: Never   Smokeless tobacco: Never  Vaping Use   Vaping status: Never Used  Substance and Sexual Activity   Alcohol use: Yes    Comment: occ   Drug use: No   Sexual activity: Yes    Birth control/protection: Condom, Other-see comments    Comment: not always  Other Topics Concern   Not on file  Social History Narrative   Not on file   Social Drivers of Health   Financial Resource  Strain: Not on file  Food Insecurity: Not on file  Transportation Needs: Not on file  Physical Activity: Not on file  Stress: Not on file  Social Connections: Not on file  Intimate Partner Violence: Not on file   Family History  Problem Relation Age of Onset   Hypertension Mother    Hypertension Father    History reviewed. No pertinent surgical history.    Rolinda Rogue, MD 06/02/23 1022

## 2023-08-23 ENCOUNTER — Ambulatory Visit (HOSPITAL_COMMUNITY)
Admission: EM | Admit: 2023-08-23 | Discharge: 2023-08-23 | Disposition: A | Discharge status: Home / Self Care | Attending: Emergency Medicine | Admitting: Emergency Medicine

## 2023-08-23 ENCOUNTER — Encounter (HOSPITAL_COMMUNITY): Payer: Self-pay

## 2023-08-23 DIAGNOSIS — Z113 Encounter for screening for infections with a predominantly sexual mode of transmission: Secondary | ICD-10-CM | POA: Diagnosis present

## 2023-08-23 DIAGNOSIS — J302 Other seasonal allergic rhinitis: Secondary | ICD-10-CM | POA: Insufficient documentation

## 2023-08-23 MED ORDER — FEXOFENADINE HCL 180 MG PO TABS: 180.0000 mg | ORAL_TABLET | Freq: Every day | ORAL | 2 refills | Status: DC

## 2023-08-23 MED ORDER — FLUTICASONE PROPIONATE 50 MCG/ACT NA SUSP: 2.0000 | Freq: Every day | NASAL | 2 refills | Status: DC

## 2023-08-23 NOTE — Discharge Instructions (Signed)
 We will call you if anything on your swab returns positive. You can also see these results on MyChart. Please abstain from sexual intercourse until your results return.  Allegra once daily, along with flonase once daily You can also take benadryl at night time

## 2023-08-23 NOTE — ED Triage Notes (Signed)
 Patient states he is here for an STD checkup and because his allergies are bad. Patient states he has tried OTC zyrtec with no relief. Patient denies exposure to STD, just wants a checkup.

## 2023-08-23 NOTE — ED Provider Notes (Signed)
 [facility]    CSN: [id number] Arrival date & time: 08/23/23  1002     History   Chief Complaint Chief Complaint  Patient presents with   SEXUALLY TRANSMITTED DISEASE    HPI Michael Hurst is a 23 y.o. male.  Presents for STD testing Denies known exposure. Not having any symptoms at this time. Reports unprotected intercourse.   Also reporting seasonal allergies. Some congestion, itchy eyes, sneezing. Has used zyrtec for 4 days but feels it doesn't work  Past Medical History:  Diagnosis Date   Hypertension     There are no active problems to display for this patient.   History reviewed. No pertinent surgical history.   Home Medications    Prior to Admission medications   Medication Sig Start Date End Date Taking? Authorizing Provider  fexofenadine (ALLEGRA) 180 MG tablet Take 1 tablet (180 mg total) by mouth daily. 08/23/23  Yes Catori Panozzo, Lurena Joiner, PA-C  fluticasone (FLONASE) 50 MCG/ACT nasal spray Place 2 sprays into both nostrils daily. 08/23/23  Yes Channelle Bottger, Lurena Joiner, PA-C    Family History Family History  Problem Relation Age of Onset   Hypertension Mother    Hypertension Father     Social History Social History   Tobacco Use   Smoking status: Never   Smokeless tobacco: Never  Vaping Use   Vaping status: Never Used  Substance Use Topics   Alcohol use: Yes    Comment: occ   Drug use: No     Allergies   Other   Review of Systems Review of Systems Per HPI  Physical Exam Triage Vital Signs ED Triage Vitals [08/23/23 1025]  Encounter Vitals Group     BP (!) 159/98     Systolic BP Percentile      Diastolic BP Percentile      Pulse Rate 71     Resp 20     Temp 98.5 F (36.9 C)     Temp Source Oral     SpO2 97 %     Weight      Height      Head Circumference      Peak Flow      Pain Score 0     Pain Loc      Pain Education      Exclude from Growth Chart    No data found.  Updated Vital Signs BP (!) 159/98 (BP Location: Left  Arm)   Pulse 71   Temp 98.5 F (36.9 C) (Oral)   Resp 20   SpO2 97%    Physical Exam Vitals and nursing note reviewed.  Constitutional:      General: He is not in acute distress.    Appearance: Normal appearance.  HENT:     Right Ear: Tympanic membrane and ear canal normal.     Left Ear: Tympanic membrane and ear canal normal.     Mouth/Throat:     Mouth: Mucous membranes are moist.     Pharynx: Oropharynx is clear.  Eyes:     Conjunctiva/sclera: Conjunctivae normal.  Cardiovascular:     Rate and Rhythm: Normal rate and regular rhythm.     Pulses: Normal pulses.     Heart sounds: Normal heart sounds.  Pulmonary:     Effort: Pulmonary effort is normal.     Breath sounds: Normal breath sounds.  Neurological:     Mental Status: He is alert and oriented to person, place, and time.     UC Treatments /  Results  Labs (all labs ordered are listed, but only abnormal results are displayed) Labs Reviewed  CYTOLOGY, (ORAL, ANAL, URETHRAL) ANCILLARY ONLY    EKG  Radiology No results found.  Procedures Procedures   Medications Ordered in UC Medications - No data to display  Initial Impression / Assessment and Plan / UC Course  I have reviewed the triage vital signs and the nursing notes.  Pertinent labs & imaging results that were available during my care of the patient were reviewed by me and considered in my medical decision making (see chart for details).  STD screen Cytology swab pending Treat positive result as indicated Safe sex practices No questions at this time  Seasonal allergies Switch to allegra 180 mg daily  Can add benadryl at night time Flonase nasal spray  Final Clinical Impressions(s) / UC Diagnoses   Final diagnoses:  Screen for STD (sexually transmitted disease)  Seasonal allergies     Discharge Instructions      We will call you if anything on your swab returns positive. You can also see these results on MyChart. Please abstain from  sexual intercourse until your results return.  Allegra once daily, along with flonase once daily You can also take benadryl at night time     ED Prescriptions     Medication Sig Dispense Auth. Provider   fexofenadine (ALLEGRA) 180 MG tablet Take 1 tablet (180 mg total) by mouth daily. 30 tablet Umeka Wrench, PA-C   fluticasone (FLONASE) 50 MCG/ACT nasal spray Place 2 sprays into both nostrils daily. 9.9 mL Randol Zumstein, Lurena Joiner, PA-C      PDMP not reviewed this encounter.   Providence Stivers, Lurena Joiner, PA-C 08/23/23 1049

## 2023-08-25 LAB — CYTOLOGY, (ORAL, ANAL, URETHRAL) ANCILLARY ONLY
Chlamydia: NEGATIVE
Comment: NEGATIVE
Comment: NEGATIVE
Comment: NORMAL
Neisseria Gonorrhea: NEGATIVE
Trichomonas: NEGATIVE

## 2023-09-21 ENCOUNTER — Encounter: Payer: Self-pay | Admitting: Family Medicine

## 2023-09-21 ENCOUNTER — Ambulatory Visit (HOSPITAL_COMMUNITY)
Admission: EM | Admit: 2023-09-21 | Discharge: 2023-09-21 | Disposition: A | Discharge status: Home / Self Care | Attending: Family Medicine | Admitting: Family Medicine

## 2023-09-21 ENCOUNTER — Encounter (HOSPITAL_COMMUNITY): Payer: Self-pay

## 2023-09-21 DIAGNOSIS — Z113 Encounter for screening for infections with a predominantly sexual mode of transmission: Secondary | ICD-10-CM | POA: Diagnosis present

## 2023-09-21 LAB — HIV ANTIBODY (ROUTINE TESTING W REFLEX): HIV Screen 4th Generation wRfx: NONREACTIVE

## 2023-09-21 NOTE — ED Provider Notes (Signed)
 [facility]    CSN: [id number] Arrival date & time: 09/21/23  1053      History   Chief Complaint Chief Complaint  Patient presents with   STD testing   HPI Michael Hurst is a 23 y.o. male.   The history is provided by the patient. No language interpreter was used.  STD screen. He has a new partner and would like to check his STD status. He has been with his new partner x 1 month. He uses condoms regularly. Denies any symptoms.   Past Medical History:  Diagnosis Date   Hypertension     There are no active problems to display for this patient.   History reviewed. No pertinent surgical history.     Home Medications    Prior to Admission medications   Medication Sig Start Date End Date Taking? Authorizing Provider  fexofenadine  (ALLEGRA ) 180 MG tablet Take 1 tablet (180 mg total) by mouth daily. 08/23/23   Rising, Ivette Marks, PA-C  fluticasone  (FLONASE ) 50 MCG/ACT nasal spray Place 2 sprays into both nostrils daily. 08/23/23   Rising, Ivette Marks, PA-C    Family History Family History  Problem Relation Age of Onset   Hypertension Mother    Hypertension Father     Social History Social History   Tobacco Use   Smoking status: Never   Smokeless tobacco: Never  Vaping Use   Vaping status: Never Used  Substance Use Topics   Alcohol use: Yes    Comment: occ   Drug use: No     Allergies   Other   Review of Systems Review of Systems   Physical Exam Triage Vital Signs ED Triage Vitals [09/21/23 1122]  Encounter Vitals Group     BP 130/82     Systolic BP Percentile      Diastolic BP Percentile      Pulse Rate 72     Resp 16     Temp 98.2 F (36.8 C)     Temp Source Oral     SpO2 98 %     Weight      Height      Head Circumference      Peak Flow      Pain Score 0     Pain Loc      Pain Education      Exclude from Growth Chart    No data found.  Updated Vital Signs BP 130/82 (BP Location: Left Arm)   Pulse 72   Temp 98.2 F (36.8  C) (Oral)   Resp 16   SpO2 98%   Visual Acuity Right Eye Distance:   Left Eye Distance:   Bilateral Distance:    Right Eye Near:   Left Eye Near:    Bilateral Near:     Physical Exam Vitals and nursing note reviewed.  Cardiovascular:     Rate and Rhythm: Normal rate and regular rhythm.     Heart sounds: Normal heart sounds. No murmur heard. Pulmonary:     Effort: Pulmonary effort is normal. No respiratory distress.     Breath sounds: Normal breath sounds. No wheezing.  Genitourinary:    Comments: Deferred     UC Treatments / Results  Labs (all labs ordered are listed, but only abnormal results are displayed) Labs Reviewed  HIV ANTIBODY (ROUTINE TESTING W REFLEX)  RPR  CYTOLOGY, (ORAL, ANAL, URETHRAL) ANCILLARY ONLY    EKG   Radiology No results found.  Procedures Procedures (including critical care  time)  Medications Ordered in UC Medications - No data to display  Initial Impression / Assessment and Plan / UC Course  I have reviewed the triage vital signs and the nursing notes.  Pertinent labs & imaging results that were available during my care of the patient were reviewed by me and considered in my medical decision making (see chart for details).  Clinical Course as of 09/21/23 1159  Sun Sep 21, 2023  1158 STD screening Denies exposure or symptoms HIV and RPR checked 5 months ago and were negative Will recheck today Urethral swab for STD screening collected Counseling and handout provided F/U as needed [KE]    Clinical Course User Index [KE] Arn Lane, MD     Final Clinical Impressions(s) / UC Diagnoses   Final diagnoses:  None   Discharge Instructions   None    ED Prescriptions   None    PDMP not reviewed this encounter.   Arn Lane, MD 09/21/23 1159

## 2023-09-21 NOTE — Discharge Instructions (Addendum)
 It was nice seeing you today.  Everett Hitt, our registered nurse, will call you about all your test results and follow-up plans if needed. Please follow up with your PCP soon and return to us  or the emergency department if your symptoms worsen.  Best regards, [facility] Provider

## 2023-09-21 NOTE — ED Triage Notes (Signed)
 Patient is wanting STD testing due to a new sexual partner. Patient denies any symptoms.

## 2023-09-22 ENCOUNTER — Encounter: Payer: Self-pay | Admitting: Family Medicine

## 2023-09-22 LAB — CYTOLOGY, (ORAL, ANAL, URETHRAL) ANCILLARY ONLY
Chlamydia: NEGATIVE
Comment: NEGATIVE
Comment: NEGATIVE
Comment: NORMAL
Neisseria Gonorrhea: NEGATIVE
Trichomonas: NEGATIVE

## 2023-09-22 LAB — RPR: RPR Ser Ql: NONREACTIVE

## 2023-10-20 ENCOUNTER — Ambulatory Visit: Admission: EM | Admit: 2023-10-20 | Discharge: 2023-10-20 | Disposition: A | Discharge status: Home / Self Care

## 2023-10-20 DIAGNOSIS — Z113 Encounter for screening for infections with a predominantly sexual mode of transmission: Secondary | ICD-10-CM | POA: Diagnosis not present

## 2023-10-20 NOTE — ED Triage Notes (Signed)
"  I am here for my routine STI labs". I do not have a PCP, No Concerns, No symptoms. Note: Discussed need for PCP for Asyptomatic testing.

## 2023-10-20 NOTE — ED Provider Notes (Signed)
 [facility]    CSN: [id number] Arrival date & time: 10/20/23  0843      History   Chief Complaint Chief Complaint  Patient presents with   SEXUALLY TRANSMITTED DISEASE    Asymptomatic    HPI Michael Hurst is a 23 y.o. male.   Patient here today for STD screening.  He denies any symptoms.  He has not had any known exposures.  The history is provided by the patient.    Past Medical History:  Diagnosis Date   Hypertension     There are no active problems to display for this patient.   History reviewed. No pertinent surgical history.     Home Medications    Prior to Admission medications   Medication Sig Start Date End Date Taking? Authorizing Provider  amoxicillin  (AMOXIL ) 500 MG capsule Take 500 mg by mouth 3 (three) times daily. 06/27/23  Yes [provider]  metroNIDAZOLE  (FLAGYL ) 500 MG tablet Take 2,000 mg by mouth once. 05/02/23  Yes [provider]  fexofenadine  (ALLEGRA ) 180 MG tablet Take 1 tablet (180 mg total) by mouth daily. 08/23/23   Rising, Ivette Marks, PA-C  fluticasone  (FLONASE ) 50 MCG/ACT nasal spray Place 2 sprays into both nostrils daily. 08/23/23   Rising, Ivette Marks, PA-C    Family History Family History  Problem Relation Age of Onset   Hypertension Mother    Hypertension Father     Social History Social History   Tobacco Use   Smoking status: Never   Smokeless tobacco: Never  Vaping Use   Vaping status: Never Used  Substance Use Topics   Alcohol use: Yes    Comment: occ   Drug use: No     Allergies   Other   Review of Systems Review of Systems  Constitutional:  Negative for chills and fever.  Eyes:  Negative for discharge and redness.  Respiratory:  Negative for shortness of breath.   Gastrointestinal:  Negative for abdominal pain, nausea and vomiting.  Genitourinary:  Negative for genital sores and penile discharge.  Musculoskeletal:  Negative for back pain.  Neurological:  Negative for numbness.      Physical Exam Triage Vital Signs ED Triage Vitals  Encounter Vitals Group     BP      Systolic BP Percentile      Diastolic BP Percentile      Pulse      Resp      Temp      Temp src      SpO2      Weight      Height      Head Circumference      Peak Flow      Pain Score      Pain Loc      Pain Education      Exclude from Growth Chart    No data found.  Updated Vital Signs BP (!) 143/81 (BP Location: Left Arm)   Pulse 83   Temp 98.5 F (36.9 C) (Oral)   Resp 18   Ht 6\' 3"  (1.905 m)   Wt 255 lb 1.2 oz (115.7 kg)   SpO2 98%   BMI 31.88 kg/m   Visual Acuity Right Eye Distance:   Left Eye Distance:   Bilateral Distance:    Right Eye Near:   Left Eye Near:    Bilateral Near:     Physical Exam Vitals and nursing note reviewed.  Constitutional:      General: He  is not in acute distress.    Appearance: Normal appearance. He is not ill-appearing.  HENT:     Head: Normocephalic and atraumatic.  Eyes:     Conjunctiva/sclera: Conjunctivae normal.  Cardiovascular:     Rate and Rhythm: Normal rate.  Pulmonary:     Effort: Pulmonary effort is normal. No respiratory distress.  Neurological:     Mental Status: He is alert.  Psychiatric:        Mood and Affect: Mood normal.        Behavior: Behavior normal.        Thought Content: Thought content normal.      UC Treatments / Results  Labs (all labs ordered are listed, but only abnormal results are displayed) Labs Reviewed  CYTOLOGY, (ORAL, ANAL, URETHRAL) ANCILLARY ONLY    EKG   Radiology No results found.  Procedures Procedures (including critical care time)  Medications Ordered in UC Medications - No data to display  Initial Impression / Assessment and Plan / UC Course  I have reviewed the triage vital signs and the nursing notes.  Pertinent labs & imaging results that were available during my care of the patient were reviewed by me and considered in my medical decision making (see  chart for details).    STD screening ordered as requested.  Recommend follow-up with any concerns.  Will await results for further recommendation.  Final Clinical Impressions(s) / UC Diagnoses   Final diagnoses:  Screening for STD (sexually transmitted disease)   Discharge Instructions   None    ED Prescriptions   None    PDMP not reviewed this encounter.   Vernestine Gondola, PA-C 10/20/23 1000

## 2023-10-21 LAB — CYTOLOGY, (ORAL, ANAL, URETHRAL) ANCILLARY ONLY
Chlamydia: NEGATIVE
Comment: NEGATIVE
Comment: NEGATIVE
Comment: NORMAL
Neisseria Gonorrhea: NEGATIVE
Trichomonas: NEGATIVE

## 2023-11-19 ENCOUNTER — Encounter: Payer: Self-pay | Admitting: Emergency Medicine

## 2023-11-19 ENCOUNTER — Ambulatory Visit
Admission: EM | Admit: 2023-11-19 | Discharge: 2023-11-19 | Disposition: A | Discharge status: Home / Self Care | Attending: Nurse Practitioner | Admitting: Nurse Practitioner

## 2023-11-19 DIAGNOSIS — Z113 Encounter for screening for infections with a predominantly sexual mode of transmission: Secondary | ICD-10-CM | POA: Insufficient documentation

## 2023-11-19 DIAGNOSIS — Z202 Contact with and (suspected) exposure to infections with a predominantly sexual mode of transmission: Secondary | ICD-10-CM | POA: Insufficient documentation

## 2023-11-19 NOTE — Discharge Instructions (Addendum)
Testing for gonorrhea, chlamydia and trichomonas is pending. You should not have any sexual activity until you receive the results of the tests. You will only be notified for positive results. You may go online to MyChart and review your results. Practice safe sex practices by wearing a condom every time you have sex. Remember that people who have STIs may not experience any symptoms. However, even without symptoms, these infections can be spread from person to person and require treatment. STIs can be treated, and many STIs can be cured. However, some STIs cannot be cured and will affect you for the rest of your life. It's important to be checked regularly for STIs. You should also consider taking pre-exposure prophylaxis (PrEP) to prevent HIV infection. 

## 2023-11-19 NOTE — ED Triage Notes (Signed)
 Pt here for STD screening. Asymptomatic but notes known exposure to trichomonas. Would like to complete cytology swab, no blood work.

## 2023-11-19 NOTE — ED Provider Notes (Signed)
 [facility]    CSN: [id number] Arrival date & time: 11/19/23  0827      History   Chief Complaint Chief Complaint  Patient presents with   Exposure to STD    HPI Michael Hurst is a 23 y.o. male.   Discussed the use of AI scribe software for clinical note transcription with the patient, who gave verbal consent to proceed.   Michael Hurst is a 23 y.o. male that presents for STD testing after being exposed to trichomoniasis. He was informed of the exposure earlier this morning. His last sexual encounter with the person who exposed him was on the 18th of June. The patient reports having two male sexual partner in the past 3 months. He denies any current symptoms such as itching, discharge, pain, burning, or swelling of his genitals. He also denies any genital sores. Michael Hurst has a history of trichomonas and chlamydia in the past. He reports that he uses condoms sometimes. The patient does not take any daily medications. He declined additional blood work during this visit.  The following portions of the patient's history were reviewed and updated as appropriate: allergies, current medications, past family history, past medical history, past social history, past surgical history, and problem list.    Past Medical History:  Diagnosis Date   Hypertension     There are no active problems to display for this patient.   History reviewed. No pertinent surgical history.     Home Medications    Prior to Admission medications   Not on File    Family History Family History  Problem Relation Age of Onset   Hypertension Mother    Hypertension Father     Social History Social History   Tobacco Use   Smoking status: Never   Smokeless tobacco: Never  Vaping Use   Vaping status: Never Used  Substance Use Topics   Alcohol use: Yes    Comment: occ   Drug use: No     Allergies   Other   Review of Systems Review of Systems  Genitourinary:  Negative  for dysuria, genital sores, penile discharge, penile pain, penile swelling, scrotal swelling and testicular pain.  All other systems reviewed and are negative.    Physical Exam Triage Vital Signs ED Triage Vitals  Encounter Vitals Group     BP 11/19/23 0840 (!) 147/98     Girls Systolic BP Percentile --      Girls Diastolic BP Percentile --      Boys Systolic BP Percentile --      Boys Diastolic BP Percentile --      Pulse Rate 11/19/23 0840 67     Resp 11/19/23 0840 14     Temp 11/19/23 0840 97.9 F (36.6 C)     Temp Source 11/19/23 0840 Oral     SpO2 11/19/23 0840 98 %     Weight --      Height --      Head Circumference --      Peak Flow --      Pain Score 11/19/23 0841 0     Pain Loc --      Pain Education --      Exclude from Growth Chart --    No data found.  Updated Vital Signs BP (!) 147/98 (BP Location: Left Arm)   Pulse 67   Temp 97.9 F (36.6 C) (Oral)   Resp 14   SpO2 98%   Visual Acuity Right  Eye Distance:   Left Eye Distance:   Bilateral Distance:    Right Eye Near:   Left Eye Near:    Bilateral Near:     Physical Exam Vitals and nursing note reviewed.  Constitutional:      General: He is not in acute distress.    Appearance: Normal appearance. He is not ill-appearing, toxic-appearing or diaphoretic.  HENT:     Head: Normocephalic.     Nose: Nose normal.     Mouth/Throat:     Mouth: Mucous membranes are moist.  Eyes:     Conjunctiva/sclera: Conjunctivae normal.  Cardiovascular:     Rate and Rhythm: Normal rate.  Pulmonary:     Effort: Pulmonary effort is normal.  Abdominal:     Palpations: Abdomen is soft.  Genitourinary:    Comments: Deferred; patient performed self-swab for Aptima testing  Musculoskeletal:        General: Normal range of motion.     Cervical back: Normal range of motion and neck supple.  Skin:    General: Skin is warm and dry.  Neurological:     General: No focal deficit present.     Mental Status: He is alert  and oriented to person, place, and time.  Psychiatric:        Mood and Affect: Mood normal.        Behavior: Behavior normal.      UC Treatments / Results  Labs (all labs ordered are listed, but only abnormal results are displayed) Labs Reviewed  CYTOLOGY, (ORAL, ANAL, URETHRAL) ANCILLARY ONLY    EKG   Radiology No results found.  Procedures Procedures (including critical care time)  Medications Ordered in UC Medications - No data to display  Initial Impression / Assessment and Plan / UC Course  I have reviewed the triage vital signs and the nursing notes.  Pertinent labs & imaging results that were available during my care of the patient were reviewed by me and considered in my medical decision making (see chart for details).     The patient is a 23 year old male presenting for STI screening following a reported exposure to trichomoniasis. He denies any genitourinary symptoms, including discharge, burning, itching, pain, or swelling. He reports having two male sexual partners in the past three months. Given the exposure history, testing was performed for gonorrhea, chlamydia, and trichomoniasis. The patient was advised he will be contacted only if results are positive and treatment is required. Counseling was provided on the importance of routine testing and practicing safe sex, including consistent condom use. He was instructed to follow up with his PCP or return to clinic if any symptoms develop or for routine screening in the future.  Today's evaluation has revealed no signs of a dangerous process. Discussed diagnosis with patient and/or guardian. Patient and/or guardian aware of their diagnosis, possible red flag symptoms to watch out for and need for close follow up. Patient and/or guardian understands verbal and written discharge instructions. Patient and/or guardian comfortable with plan and disposition.  Patient and/or guardian has a clear mental status at this time,  good insight into illness (after discussion and teaching) and has clear judgment to make decisions regarding their care  Documentation was completed with the aid of voice recognition software. Transcription may contain typographical errors. Final Clinical Impressions(s) / UC Diagnoses   Final diagnoses:  STD exposure  Screening examination for STD (sexually transmitted disease)     Discharge Instructions      Testing for gonorrhea,  chlamydia and trichomonas is pending. You should not have any sexual activity until you receive the results of the tests. You will only be notified for positive results. You may go online to MyChart and review your results. Practice safe sex practices by wearing a condom every time you have sex. Remember that people who have STIs may not experience any symptoms. However, even without symptoms, these infections can be spread from person to person and require treatment. STIs can be treated, and many STIs can be cured. However, some STIs cannot be cured and will affect you for the rest of your life. It's important to be checked regularly for STIs. You should also consider taking pre-exposure prophylaxis (PrEP) to prevent HIV infection.     ED Prescriptions   None    PDMP not reviewed this encounter.   [place], [place] 11/19/23 [phone]

## 2023-11-24 ENCOUNTER — Telehealth: Payer: Self-pay | Admitting: Emergency Medicine

## 2023-11-24 LAB — CYTOLOGY, (ORAL, ANAL, URETHRAL) ANCILLARY ONLY
Chlamydia: NEGATIVE
Comment: NEGATIVE
Comment: NEGATIVE
Comment: NORMAL
Neisseria Gonorrhea: NEGATIVE
Trichomonas: NEGATIVE

## 2023-11-24 NOTE — Telephone Encounter (Signed)
 Spoke to pt about his concern of lab results. I advised pt to allow a little more time for labs to be processed due to the holiday and weekend. Pt verbalized understanding. No further action required.

## 2024-03-07 ENCOUNTER — Ambulatory Visit
Admission: EM | Admit: 2024-03-07 | Discharge: 2024-03-07 | Disposition: A | Discharge status: Home / Self Care | Attending: Emergency Medicine | Admitting: Emergency Medicine

## 2024-03-07 ENCOUNTER — Other Ambulatory Visit: Payer: Self-pay

## 2024-03-07 ENCOUNTER — Encounter: Payer: Self-pay | Admitting: *Deleted

## 2024-03-07 DIAGNOSIS — W57XXXA Bitten or stung by nonvenomous insect and other nonvenomous arthropods, initial encounter: Secondary | ICD-10-CM | POA: Diagnosis not present

## 2024-03-07 DIAGNOSIS — Z113 Encounter for screening for infections with a predominantly sexual mode of transmission: Secondary | ICD-10-CM | POA: Insufficient documentation

## 2024-03-07 DIAGNOSIS — S50861A Insect bite (nonvenomous) of right forearm, initial encounter: Secondary | ICD-10-CM | POA: Insufficient documentation

## 2024-03-07 NOTE — Discharge Instructions (Signed)
 We will call you if anything on your swab returns positive. You can also see these results on MyChart. Please abstain from sexual intercourse until your results return.  For the insect bite, I recommend hydrocortisone cream twice daily. You can apply ice and elevate the arm to reduce swelling. It should go away in a few days. Anything worsening or changes, please return!

## 2024-03-07 NOTE — ED Provider Notes (Signed)
 [facility]    CSN: [id number] Arrival date & time: 03/07/24  9162      History   Chief Complaint Chief Complaint  Patient presents with   Insect Bite   SEXUALLY TRANSMITTED DISEASE    HPI Michael Hurst is a 23 y.o. male.  Possible insect bite on the right arm about a week ago Area is itching sometimes. Was swollen at first but that resolved  Has not attempted any interventions yet  Also requesting STD testing today.  No symptoms.  Denies penile discharge, dysuria, rash, swelling  Past Medical History:  Diagnosis Date   Hypertension     There are no active problems to display for this patient.   History reviewed. No pertinent surgical history.     Home Medications    Prior to Admission medications   Not on File    Family History Family History  Problem Relation Age of Onset   Hypertension Mother    Hypertension Father     Social History Social History   Tobacco Use   Smoking status: Never   Smokeless tobacco: Never  Vaping Use   Vaping status: Never Used  Substance Use Topics   Alcohol use: Yes    Comment: occ   Drug use: No     Allergies   Other   Review of Systems Review of Systems  As per HPI Physical Exam Triage Vital Signs ED Triage Vitals  Encounter Vitals Group     BP 03/07/24 0913 (!) 157/102     Girls Systolic BP Percentile --      Girls Diastolic BP Percentile --      Boys Systolic BP Percentile --      Boys Diastolic BP Percentile --      Pulse Rate 03/07/24 0913 70     Resp 03/07/24 0913 16     Temp 03/07/24 0913 98 F (36.7 C)     Temp Source 03/07/24 0913 Oral     SpO2 03/07/24 0913 97 %     Weight --      Height --      Head Circumference --      Peak Flow --      Pain Score 03/07/24 0909 2     Pain Loc --      Pain Education --      Exclude from Growth Chart --    No data found.  Updated Vital Signs BP (!) 148/94 (BP Location: Left Arm)   Pulse 70   Temp 98 F (36.7 C) (Oral)    Resp 16   SpO2 97%    Physical Exam Vitals and nursing note reviewed.  Constitutional:      General: He is not in acute distress.    Appearance: Normal appearance.  HENT:     Mouth/Throat:     Pharynx: Oropharynx is clear.  Cardiovascular:     Rate and Rhythm: Normal rate and regular rhythm.     Heart sounds: Normal heart sounds.  Pulmonary:     Effort: Pulmonary effort is normal.     Breath sounds: Normal breath sounds.  Musculoskeletal:     Comments: Full ROM of RUE. Strength and sensation intact. Radial pulse 2+  Skin:    General: Skin is warm and dry.     Comments: There is one small punctate lesion on the right forearm consistent with prior insect bite or sting. There is no swelling, tenderness, erythema, fluctuance, induration, drainage.   Neurological:  Mental Status: He is alert and oriented to person, place, and time.     UC Treatments / Results  Labs (all labs ordered are listed, but only abnormal results are displayed) Labs Reviewed  CYTOLOGY, (ORAL, ANAL, URETHRAL) ANCILLARY ONLY    EKG   Radiology No results found.  Procedures Procedures (including critical care time)  Medications Ordered in UC Medications - No data to display  Initial Impression / Assessment and Plan / UC Course  I have reviewed the triage vital signs and the nursing notes.  Pertinent labs & imaging results that were available during my care of the patient were reviewed by me and considered in my medical decision making (see chart for details).  Insect bite or sting  No sign of infection Advised ice, topical hydrocortisone for itching, Zyrtec/Benadryl if needed  STD screen Cytology swab pending. Treat positive result as indicated Safe sex practices No questions at this time  Final Clinical Impressions(s) / UC Diagnoses   Final diagnoses:  Screen for STD (sexually transmitted disease)  Insect bite of right forearm, initial encounter     Discharge Instructions       We will call you if anything on your swab returns positive. You can also see these results on MyChart. Please abstain from sexual intercourse until your results return.  For the insect bite, I recommend hydrocortisone cream twice daily. You can apply ice and elevate the arm to reduce swelling. It should go away in a few days. Anything worsening or changes, please return!     ED Prescriptions   None    PDMP not reviewed this encounter.   Jeryl Stabs, PA-C 03/07/24 9046

## 2024-03-07 NOTE — ED Triage Notes (Signed)
 Pt reports he thinks he was bitten by an ?insect on his right arm 1 week ago. The area is sensitive and still itches. No meds tried.  He would also like STI testing. Denies symptoms. States it's been awhile since I was tested

## 2024-03-08 LAB — CYTOLOGY, (ORAL, ANAL, URETHRAL) ANCILLARY ONLY
Chlamydia: NEGATIVE
Comment: NEGATIVE
Comment: NEGATIVE
Comment: NORMAL
Neisseria Gonorrhea: NEGATIVE
Trichomonas: NEGATIVE

## 2024-05-22 ENCOUNTER — Other Ambulatory Visit: Payer: Self-pay

## 2024-05-22 ENCOUNTER — Ambulatory Visit
Admission: EM | Admit: 2024-05-22 | Discharge: 2024-05-22 | Disposition: A | Discharge status: Home / Self Care | Attending: Family Medicine | Admitting: Family Medicine

## 2024-05-22 DIAGNOSIS — Z202 Contact with and (suspected) exposure to infections with a predominantly sexual mode of transmission: Secondary | ICD-10-CM | POA: Insufficient documentation

## 2024-05-22 NOTE — Discharge Instructions (Addendum)
 Advised patient we will follow-up with test results once received.  Advised if symptoms worsen and/or unresolved please follow-up with your PCP, [place], or here for further evaluation.

## 2024-05-22 NOTE — ED Provider Notes (Signed)
 " Michael Hurst    CSN: [id number] Arrival date & time: 05/22/24  9171      History   Chief Complaint Chief Complaint  Patient presents with   Exposure to STD    HPI Michael Hurst is a 24 y.o. male.   HPI Pleasant 24 year old male presents with possible STD request testing.  PMH significant for obesity and HTN.  Past Medical History:  Diagnosis Date   Hypertension     There are no active problems to display for this patient.   History reviewed. No pertinent surgical history.     Home Medications    Prior to Admission medications  Not on File    Family History Family History  Problem Relation Age of Onset   Hypertension Mother    Hypertension Father     Social History Social History[1]   Allergies   Other   Review of Systems Review of Systems  Genitourinary:  Negative for decreased urine volume.  All other systems reviewed and are negative.    Physical Exam Triage Vital Signs ED Triage Vitals  Encounter Vitals Group     BP      Girls Systolic BP Percentile      Girls Diastolic BP Percentile      Boys Systolic BP Percentile      Boys Diastolic BP Percentile      Pulse      Resp      Temp      Temp src      SpO2      Weight      Height      Head Circumference      Peak Flow      Pain Score      Pain Loc      Pain Education      Exclude from Growth Chart    No data found.  Updated Vital Signs BP (!) 157/91 (BP Location: Right Arm)   Pulse 84   Temp 99 F (37.2 C) (Oral)   Resp 20   Ht 6' 4 (1.93 m)   Wt 272 lb (123.4 kg)   SpO2 98%   BMI 33.11 kg/m    Physical Exam Vitals and nursing note reviewed.  Constitutional:      Appearance: Normal appearance. He is obese.  HENT:     Head: Normocephalic and atraumatic.     Mouth/Throat:     Mouth: Mucous membranes are moist.     Pharynx: Oropharynx is clear.  Eyes:     Extraocular Movements: Extraocular movements intact.     Conjunctiva/sclera: Conjunctivae  normal.     Pupils: Pupils are equal, round, and reactive to light.  Cardiovascular:     Rate and Rhythm: Normal rate and regular rhythm.  Pulmonary:     Effort: Pulmonary effort is normal.     Breath sounds: Normal breath sounds. No wheezing, rhonchi or rales.  Musculoskeletal:        General: Normal range of motion.  Skin:    General: Skin is warm and dry.  Neurological:     General: No focal deficit present.     Mental Status: He is alert and oriented to person, place, and time. Mental status is at baseline.  Psychiatric:        Mood and Affect: Mood normal.        Behavior: Behavior normal.      UC Treatments / Results  Labs (all labs ordered are listed, but  only abnormal results are displayed) Labs Reviewed  CYTOLOGY, (ORAL, ANAL, URETHRAL) ANCILLARY ONLY    EKG   Radiology No results found.  Procedures Procedures (including critical Hurst time)  Medications Ordered in UC Medications - No data to display  Initial Impression / Assessment and Plan / UC Course  I have reviewed the triage vital signs and the nursing notes.  Pertinent labs & imaging results that were available during my Hurst of the patient were reviewed by me and considered in my medical decision making (see chart for details).     MDM: 1.  Possible exposure to STD-cytology ordered, patient denies any current symptoms. Advised patient we will follow-up with test results once received.  Advised if symptoms worsen and/or unresolved please follow-up with your PCP, [facility], or here for further evaluation.  Patient discharged home, hemodynamically stable. Final Clinical Impressions(s) / UC Diagnoses   Final diagnoses:  Possible exposure to STD     Discharge Instructions      Advised patient we will follow-up with test results once received.  Advised if symptoms worsen and/or unresolved please follow-up with your PCP, [facility], or here for further evaluation.     ED  Prescriptions   None    PDMP not reviewed this encounter.    [1]  Social History Tobacco Use   Smoking status: Never   Smokeless tobacco: Never  Vaping Use   Vaping status: Never Used  Substance Use Topics   Alcohol use: Yes    Comment: occ   Drug use: No     Teddy Sharper, FNP 05/22/24 [phone]  "

## 2024-05-22 NOTE — ED Triage Notes (Signed)
 Pt presenting requesting  STD testing . Pt denies any symptoms.Pt stated that he does not want any blood work to be done.

## 2024-05-24 LAB — CYTOLOGY, (ORAL, ANAL, URETHRAL) ANCILLARY ONLY
Chlamydia: NEGATIVE
Comment: NEGATIVE
Comment: NEGATIVE
Comment: NORMAL
Neisseria Gonorrhea: NEGATIVE
Trichomonas: NEGATIVE
